# Patient Record
Sex: Male | Born: 1945 | Race: White | Hispanic: No | Smoking: Former smoker
Health system: Southern US, Community
[De-identification: ages and names within clinical notes are randomized; demographics above are authoritative.]

## PROBLEM LIST (undated history)

## (undated) DIAGNOSIS — M1990 Unspecified osteoarthritis, unspecified site: Secondary | ICD-10-CM

## (undated) DIAGNOSIS — K219 Gastro-esophageal reflux disease without esophagitis: Secondary | ICD-10-CM

## (undated) DIAGNOSIS — M1712 Unilateral primary osteoarthritis, left knee: Secondary | ICD-10-CM

## (undated) DIAGNOSIS — I10 Essential (primary) hypertension: Secondary | ICD-10-CM

## (undated) DIAGNOSIS — F32A Depression, unspecified: Secondary | ICD-10-CM

## (undated) DIAGNOSIS — Z9852 Vasectomy status: Secondary | ICD-10-CM

## (undated) DIAGNOSIS — E785 Hyperlipidemia, unspecified: Secondary | ICD-10-CM

## (undated) DIAGNOSIS — M719 Bursopathy, unspecified: Secondary | ICD-10-CM

## (undated) DIAGNOSIS — R519 Headache, unspecified: Secondary | ICD-10-CM

## (undated) DIAGNOSIS — R0683 Snoring: Principal | ICD-10-CM

## (undated) DIAGNOSIS — G4730 Sleep apnea, unspecified: Secondary | ICD-10-CM

## (undated) DIAGNOSIS — R51 Headache: Secondary | ICD-10-CM

## (undated) DIAGNOSIS — F329 Major depressive disorder, single episode, unspecified: Secondary | ICD-10-CM

## (undated) HISTORY — DX: Bursopathy, unspecified: M71.9

## (undated) HISTORY — DX: Unspecified osteoarthritis, unspecified site: M19.90

## (undated) HISTORY — DX: Essential (primary) hypertension: I10

## (undated) HISTORY — DX: Depression, unspecified: F32.A

## (undated) HISTORY — DX: Hyperlipidemia, unspecified: E78.5

## (undated) HISTORY — DX: Snoring: R06.83

## (undated) HISTORY — DX: Major depressive disorder, single episode, unspecified: F32.9

## (undated) HISTORY — PX: COLONOSCOPY: SHX174

## (undated) HISTORY — PX: JOINT REPLACEMENT: SHX530

## (undated) HISTORY — PX: CHOLECYSTECTOMY OPEN: SUR202

## (undated) HISTORY — PX: APPENDECTOMY: SHX54

## (undated) HISTORY — PX: CATARACT EXTRACTION W/ INTRAOCULAR LENS  IMPLANT, BILATERAL: SHX1307

## (undated) HISTORY — PX: TONSILLECTOMY AND ADENOIDECTOMY: SUR1326

---

## 1983-11-13 HISTORY — PX: KNEE ARTHROSCOPY: SUR90

## 2004-06-25 ENCOUNTER — Emergency Department (HOSPITAL_COMMUNITY): Admission: EM | Admit: 2004-06-25 | Discharge: 2004-06-25 | Payer: Self-pay | Admitting: Emergency Medicine

## 2012-09-02 DIAGNOSIS — B001 Herpesviral vesicular dermatitis: Secondary | ICD-10-CM | POA: Insufficient documentation

## 2013-01-28 DIAGNOSIS — E349 Endocrine disorder, unspecified: Secondary | ICD-10-CM | POA: Insufficient documentation

## 2014-01-27 DIAGNOSIS — R21 Rash and other nonspecific skin eruption: Secondary | ICD-10-CM | POA: Diagnosis not present

## 2014-02-16 DIAGNOSIS — M79609 Pain in unspecified limb: Secondary | ICD-10-CM | POA: Diagnosis not present

## 2014-03-23 DIAGNOSIS — M1710 Unilateral primary osteoarthritis, unspecified knee: Secondary | ICD-10-CM | POA: Diagnosis not present

## 2014-04-02 ENCOUNTER — Encounter (HOSPITAL_COMMUNITY): Payer: Self-pay | Admitting: Emergency Medicine

## 2014-04-02 ENCOUNTER — Emergency Department (HOSPITAL_COMMUNITY)
Admission: EM | Admit: 2014-04-02 | Discharge: 2014-04-02 | Disposition: A | Discharge status: Home / Self Care | Payer: Medicare Other | Attending: Emergency Medicine | Admitting: Emergency Medicine

## 2014-04-02 DIAGNOSIS — Z79899 Other long term (current) drug therapy: Secondary | ICD-10-CM | POA: Diagnosis not present

## 2014-04-02 DIAGNOSIS — S81009A Unspecified open wound, unspecified knee, initial encounter: Secondary | ICD-10-CM | POA: Diagnosis not present

## 2014-04-02 DIAGNOSIS — S91009A Unspecified open wound, unspecified ankle, initial encounter: Secondary | ICD-10-CM | POA: Diagnosis not present

## 2014-04-02 DIAGNOSIS — Z87891 Personal history of nicotine dependence: Secondary | ICD-10-CM | POA: Insufficient documentation

## 2014-04-02 DIAGNOSIS — Y929 Unspecified place or not applicable: Secondary | ICD-10-CM | POA: Insufficient documentation

## 2014-04-02 DIAGNOSIS — S81809A Unspecified open wound, unspecified lower leg, initial encounter: Principal | ICD-10-CM

## 2014-04-02 DIAGNOSIS — S81811A Laceration without foreign body, right lower leg, initial encounter: Secondary | ICD-10-CM

## 2014-04-02 DIAGNOSIS — Y939 Activity, unspecified: Secondary | ICD-10-CM | POA: Insufficient documentation

## 2014-04-02 DIAGNOSIS — W268XXA Contact with other sharp object(s), not elsewhere classified, initial encounter: Secondary | ICD-10-CM | POA: Insufficient documentation

## 2014-04-02 NOTE — ED Provider Notes (Signed)
CSN: [id number]     Arrival date & time 04/02/14  4132 History  This chart was scribed for non-physician practitioner working with Blanchard Kelch, MD, by Fernando Clark, ED Scribe. This patient was seen in room TR11C/TR11C and the patient's care was started at 7:37 PM.    Chief Complaint  Patient presents with  . Extremity Laceration      Patient is a 68 y.o. male presenting with skin laceration. The history is provided by the patient. No language interpreter was used.  Laceration Location:  Leg Leg laceration location:  R lower leg Depth:  Cutaneous Quality: straight   Bleeding: controlled   Injury mechanism: broken ceramic dish. Foreign body present:  No foreign bodies Relieved by:  None tried Worsened by:  Nothing tried Ineffective treatments:  None tried Tetanus status:  Up to date  HPI Comments: Fernando Clark is a 68 y.o. male who presents to the Emergency Department complaining of laceration to his right lower leg. Patient states that the laceration occurred when he accidentally kicked a ceramic dog dish and it came up and cut his leg. Bleeding is controlled at this time. Patient denies any difficulty ambulating. Patient denies any personal history of DM. He states that his last t-dap was within the last 1-2 years. Patient states that he does not have a PCP currently, he recently moved from [place], but he is trying to find one. Patient denies any numbness or pain in his foot.     History reviewed. No pertinent past medical history. Past Surgical History  Procedure Laterality Date  . Cholecystectomy     No family history on file. History  Substance Use Topics  . Smoking status: Former Research scientist (life sciences)  . Smokeless tobacco: Not on file  . Alcohol Use: Yes    Review of Systems  Musculoskeletal: Negative for myalgias.  Skin: Positive for wound (laceration to right lower leg).  Neurological: Negative for numbness.  All other systems reviewed and are  negative.     Allergies  Review of patient's allergies indicates no known allergies.  Home Medications   Prior to Admission medications   Medication Sig Start Date End Date Taking? Authorizing Provider  atorvastatin (LIPITOR) 20 MG tablet Take 20 mg by mouth daily.   Yes Historical Provider, MD  citalopram (CELEXA) 20 MG tablet Take 20 mg by mouth daily.   Yes Historical Provider, MD  ibuprofen (ADVIL,MOTRIN) 200 MG tablet Take 600 mg by mouth every 6 (six) hours as needed for mild pain.   Yes Historical Provider, MD   There were no vitals taken for this visit.  Physical Exam  Nursing note and vitals reviewed. Constitutional: He is oriented to person, place, and time. He appears well-developed and well-nourished. No distress.  HENT:  Head: Normocephalic and atraumatic.  Eyes: EOM are normal.  Neck: Neck supple. No tracheal deviation present.  Cardiovascular: Normal rate.   Pulmonary/Chest: Effort normal. No respiratory distress.  Musculoskeletal: Normal range of motion.  Neurological: He is alert and oriented to person, place, and time.  Skin: Skin is warm and dry.  Psychiatric: He has a normal mood and affect. His behavior is normal.    ED Course  Procedures (including critical care time)   COORDINATION OF CARE: 7:43 PM- Will order wound care. Pt advised of plan for treatment and pt agrees.  LACERATION REPAIR Performed by: Fernando Quill, NP Consent: Verbal consent obtained. Risks and benefits: risks, benefits and alternatives were discussed Patient identity confirmed: provided demographic data Time  out performed prior to procedure Prepped and Draped in normal sterile fashion Wound explored Laceration Location: right lower leg Laceration Length: 5 cm No Foreign Bodies seen or palpated Anesthesia: local infiltration Local anesthetic: lidocaine 2% no epinephrine Anesthetic total: 3 ml Irrigation method: syringe Amount of cleaning: standard Skin closure:  staples Number of  staples: 9 Patient tolerance: Patient tolerated the procedure well with no immediate complications.   Labs Review Labs Reviewed - No data to display  Imaging Review No results found.   EKG Interpretation None      MDM   Final diagnoses:  None    Right lower leg laceration.  Tetanus up to date.  I personally performed the services described in this documentation, which was scribed in my presence. The recorded information has been reviewed and is accurate.     Fernando Herrlich, NP 04/03/14 [phone]

## 2014-04-02 NOTE — ED Notes (Signed)
Pt. presents with right lower leg laceration approx . 2 inches accidentally kicked dog's plate and hit his leg , ambulatory , minimal bleeding at arrival .

## 2014-04-02 NOTE — Discharge Instructions (Signed)
Staple Wound Closure Staples are used to help a wound heal faster by holding the edges of the wound together. HOME CARE  Keep the area around the staples clean and dry.  Rest and raise (elevate) the injured part above the level of your heart.  See your doctor for a follow-up check of the wound.  See your doctor to have the staples removed.  Clean the wound daily with water.  Do not soak the wound in water for long periods of time.  Let air reach the wound as it heals. GET HELP RIGHT AWAY IF:   You have redness or puffiness around the wound.  You have a red line going away from the wound.  You have more pain or tenderness.  You have yellowish-white fluid (pus) coming from the wound.  Your wound does not stay together after the staples have been taken out.  You see something coming out of the wound, such as wood or glass.  You have problems moving the injured area.  You have a fever or lasting symptoms for more than 2-3 days.  You have a fever and your symptoms suddenly get worse. MAKE SURE YOU:   Understand these instructions.  Will watch this condition.  Will get help right away if you are not doing well or get worse. Document Released: 08/07/2008 Document Revised: 07/23/2012 Document Reviewed: 05/11/2012 [facility] Patient Information 2014 [place].

## 2014-04-03 NOTE — ED Provider Notes (Signed)
Medical screening examination/treatment/procedure(s) were performed by non-physician practitioner and as supervising physician I was immediately available for consultation/collaboration.   EKG Interpretation None        Blanchard Kelch, MD 04/03/14 1238

## 2014-04-12 DIAGNOSIS — Z4802 Encounter for removal of sutures: Secondary | ICD-10-CM | POA: Diagnosis not present

## 2014-05-24 DIAGNOSIS — W57XXXA Bitten or stung by nonvenomous insect and other nonvenomous arthropods, initial encounter: Secondary | ICD-10-CM | POA: Diagnosis not present

## 2014-05-24 DIAGNOSIS — S40269A Insect bite (nonvenomous) of unspecified shoulder, initial encounter: Secondary | ICD-10-CM | POA: Diagnosis not present

## 2014-06-24 DIAGNOSIS — L909 Atrophic disorder of skin, unspecified: Secondary | ICD-10-CM | POA: Diagnosis not present

## 2014-06-24 DIAGNOSIS — L919 Hypertrophic disorder of the skin, unspecified: Secondary | ICD-10-CM | POA: Diagnosis not present

## 2014-07-19 DIAGNOSIS — W540XXA Bitten by dog, initial encounter: Secondary | ICD-10-CM | POA: Diagnosis not present

## 2014-07-19 DIAGNOSIS — S61409A Unspecified open wound of unspecified hand, initial encounter: Secondary | ICD-10-CM | POA: Diagnosis not present

## 2014-09-01 DIAGNOSIS — Z23 Encounter for immunization: Secondary | ICD-10-CM | POA: Diagnosis not present

## 2014-09-07 DIAGNOSIS — M1811 Unilateral primary osteoarthritis of first carpometacarpal joint, right hand: Secondary | ICD-10-CM | POA: Diagnosis not present

## 2014-09-17 DIAGNOSIS — M25562 Pain in left knee: Secondary | ICD-10-CM | POA: Diagnosis not present

## 2014-09-30 DIAGNOSIS — M7660 Achilles tendinitis, unspecified leg: Secondary | ICD-10-CM | POA: Diagnosis not present

## 2014-09-30 DIAGNOSIS — M7150 Other bursitis, not elsewhere classified, unspecified site: Secondary | ICD-10-CM | POA: Diagnosis not present

## 2014-10-05 DIAGNOSIS — Z6835 Body mass index (BMI) 35.0-35.9, adult: Secondary | ICD-10-CM | POA: Diagnosis not present

## 2014-10-05 DIAGNOSIS — R5383 Other fatigue: Secondary | ICD-10-CM | POA: Diagnosis not present

## 2014-10-05 DIAGNOSIS — E785 Hyperlipidemia, unspecified: Secondary | ICD-10-CM | POA: Diagnosis not present

## 2014-10-05 DIAGNOSIS — F329 Major depressive disorder, single episode, unspecified: Secondary | ICD-10-CM | POA: Diagnosis not present

## 2014-10-05 DIAGNOSIS — M1990 Unspecified osteoarthritis, unspecified site: Secondary | ICD-10-CM | POA: Diagnosis not present

## 2014-10-05 DIAGNOSIS — R0683 Snoring: Secondary | ICD-10-CM | POA: Diagnosis not present

## 2014-10-14 ENCOUNTER — Encounter: Payer: Self-pay | Admitting: *Deleted

## 2014-10-14 ENCOUNTER — Ambulatory Visit (INDEPENDENT_AMBULATORY_CARE_PROVIDER_SITE_OTHER): Payer: Medicare Other | Admitting: Neurology

## 2014-10-14 ENCOUNTER — Encounter: Payer: Self-pay | Admitting: Neurology

## 2014-10-14 VITALS — BP 149/86 | HR 74 | Resp 16 | Ht 73.5 in | Wt 261.0 lb

## 2014-10-14 DIAGNOSIS — R0683 Snoring: Secondary | ICD-10-CM | POA: Diagnosis not present

## 2014-10-14 DIAGNOSIS — E65 Localized adiposity: Secondary | ICD-10-CM | POA: Diagnosis not present

## 2014-10-14 DIAGNOSIS — J342 Deviated nasal septum: Secondary | ICD-10-CM | POA: Diagnosis not present

## 2014-10-14 HISTORY — DX: Snoring: R06.83

## 2014-10-14 NOTE — Progress Notes (Signed)
SLEEP MEDICINE CLINIC   Provider:  Larey Seat, M D  Referring Provider: Velna Hatchet, MD Primary Care Physician:   Chief Complaint  Patient presents with  . NP [facility]    Rm 11, alone    HPI:  Fernando Clark is a 68 y.o. male , caucasian, right handed, retired - and seen here as a referral from Dr. Ardeth Perfect for a sleep evaluation.   Reports daytime fatigue and sleepiness, as well as nocturnal snoring, which has been witnessed by his wife. Apneas have not been noted, and the patient reports that he has a fragmented sleep pattern waking up at 4 AM . The patient goes to bed at 10 PM sometimes at 12 PM, usually sleeps promptly- he will have one bathroom break, no more. He sleeps with his wife in the same bed as well as with 2 large dogs ( boxer - 80 pounds plus each ). He usually sleep for 5 hours only.  He prefers lateral recumbent or prone sleep, and wakes up in these positions, never supine. 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 He used to smoke  until 10 years ago, 1 ppd .   He reports getting excessively daytime sleepy while driving, and often takes a power nap in the car before continuing en route. He has no problems in conference , in church or theater. Tonsillectomy in childhood, age 54.   Review of Systems: Out of a complete 14 system review, the patient complains of only the following symptoms, and all other reviewed systems are negative. smoking was a nervous habit, he has no depression or anxiety compliant. He has had anxiety during times of high stress.  Snoring, but unsure if apnea is present. No sleep choking.  patient has headaches daily, which he attributes part from caffeine, part from sinus, part from neck tension.   Epworth score 16 , Fatigue severity score 46  , geriatric  depression score 7.   Non smoker, rare ETOH drinker, caffeine in form of sweet tea, 4 glasses per meal.    History   Social History    . Marital Status: Married    Spouse Name: N/A    Number of Children: N/A  . Years of Education: N/A   Occupational History  . Not on file.   Social History Main Topics  . Smoking status: Former Smoker -- 3.00 packs/day    Types: Cigarettes    Quit date: 11/13/2003  . Smokeless tobacco: Not on file  . Alcohol Use: 0.0 oz/week    0 Not specified per week     Comment: social  . Drug Use: No  . Sexual Activity: Not on file   Other Topics Concern  . Not on file   Social History Narrative   Married, 3 kids, 5 grandkids. Retired. (farmer, Customer service manager).  Caffeine 4 cups daily.    Family History  Problem Relation Age of Onset  . Arthritis Mother   . Breast cancer Mother   . Depression Mother   . Hypertension Mother   . Depression Father   . Stroke Father   . Arthritis Sister   . Cancer      grandparent    Past Medical History  Diagnosis Date  . Osteoarthritis     L knee  . Depression     tempermental mood  . HLD (hyperlipidemia)   . Bursitis     R heel  .  Snoring   . Snoring 10/14/2014    Past Surgical History  Procedure Laterality Date  . Cholecystectomy    . Tonsillectomy and adnoidectomy      Current Outpatient Prescriptions  Medication Sig Dispense Refill  . atorvastatin (LIPITOR) 20 MG tablet Take 20 mg by mouth daily.    . citalopram (CELEXA) 20 MG tablet Take 20 mg by mouth daily.    Marland Kitchen ibuprofen (ADVIL,MOTRIN) 200 MG tablet Take 600 mg by mouth every 6 (six) hours as needed for mild pain.    . valACYclovir (VALTREX) 1000 MG tablet as needed. Take 2 tabs at onsetRepeat in 12 hours     No current facility-administered medications for this visit.    Allergies as of 10/14/2014  . (No Known Allergies)    Vitals: BP 149/86 mmHg  Pulse 74  Resp 16  Ht 6' 1.5" (1.867 m)  Wt 261 lb (118.389 kg)  BMI 33.96 kg/m2 Last Weight:  Wt Readings from Last 1 Encounters:  10/14/14 261 lb (118.389 kg)       Last Height:   Ht Readings from Last 1 Encounters:   10/14/14 6' 1.5" (1.867 m)    Physical exam:  General: The patient is awake, alert and appears not in acute distress. The patient is well groomed.  Weight gain since retirement in the last 14 month , 15 pounds/ Not a regular exerciser.  Head: Normocephalic, atraumatic. Neck is supple. Mallampati 5 ,  neck circumference: 17 . Nasal airflow  Unrestricted, he had a nasal fracture in childhood, TMJ is  Not  evident . Retrognathia is not seen.  Cardiovascular:  Regular rate and rhythm, without  murmurs or carotid bruit, and without distended neck veins. Respiratory: Lungs are clear to auscultation. Skin:  Without evidence of edema, or rash Trunk: BMI is  elevated and patient  has normal posture.  Neurologic exam : The patient is awake and alert, oriented to place and time.   Memory subjective described as intact. There is a normal attention span & concentration ability.  Speech is fluent without dysarthria, dysphonia or aphasia. Mood and affect are appropriate.  Cranial nerves: Pupils are equal and briskly reactive to light.  Funduscopic exam without evidence of pallor or edema. Extraocular movements  in vertical and horizontal planes intact and without nystagmus. Visual fields by finger perimetry are intact. Hearing to finger rub intact.  Facial sensation intact to fine touch. Facial motor strength is symmetric and tongue and uvula move midline.  Motor exam:  Normal tone, muscle bulk and symmetric, strength in all extremities. He l has some arthritic changes in his hands and fingers, having difficulties to open a jar lid. He has noted knee pain on the left knee, he has mid foot pain, at the Charcot line , while playing golf.    Sensory:  Fine touch, pinprick and vibration were tested in all extremities.  Proprioception is tested in the upper extremities only. This was normal.  Coordination: Rapid alternating movements in the fingers/hands is normal.  Finger-to-nose maneuver  normal  without evidence of ataxia, dysmetria or tremor.  Gait and station: Patient walks without assistive device and is able unassisted to climb up to the exam table.  Strength within normal limits. Stance is stable and normal.  Tandem gait is unfragmented. Romberg testing is  negative.  Deep tendon reflexes: in the upper and lower extremities are symmetric and intact. Babinski maneuver response is downgoing.   Assessment:  After physical and neurologic examination, review of  laboratory studies, imaging, neurophysiology testing and pre-existing records, assessment is   1)  Overweight patient with high grade Mallampati and neck size, full facial hair. He snores,   The patient was advised of the nature of the diagnosed sleep disorder , the treatment options and risks for general a health and wellness arising from not treating the condition.  Visit duration was 30  minutes.   Plan:  Treatment plan and additional workup : SPLIT study with CO2 , AHi 15 and score at  Patient has headaches daily, which he attributes part from caffeine, part from sinus, part from neck tension.      Asencion Partridge Lavon Horn MD  10/14/2014

## 2014-10-14 NOTE — Patient Instructions (Signed)
Sleep Apnea  Sleep apnea is a sleep disorder characterized by abnormal pauses in breathing while you sleep. When your breathing pauses, the level of oxygen in your blood decreases. This causes you to move out of deep sleep and into light sleep. As a result, your quality of sleep is poor, and the system that carries your blood throughout your body (cardiovascular system) experiences stress. If sleep apnea remains untreated, the following conditions can develop:  High blood pressure (hypertension).  Coronary artery disease.  Inability to achieve or maintain an erection (impotence).  Impairment of your thought process (cognitive dysfunction). There are three types of sleep apnea: 1. Obstructive sleep apnea--Pauses in breathing during sleep because of a blocked airway. 2. Central sleep apnea--Pauses in breathing during sleep because the area of the brain that controls your breathing does not send the correct signals to the muscles that control breathing. 3. Mixed sleep apnea--A combination of both obstructive and central sleep apnea. RISK FACTORS The following risk factors can increase your risk of developing sleep apnea:  Being overweight.  Smoking.  Having narrow passages in your nose and throat.  Being of older age.  Being male.  Alcohol use.  Sedative and tranquilizer use.  Ethnicity. Among individuals younger than 35 years, African Americans are at increased risk of sleep apnea. SYMPTOMS   Difficulty staying asleep.  Daytime sleepiness and fatigue.  Loss of energy.  Irritability.  Loud, heavy snoring.  Morning headaches.  Trouble concentrating.  Forgetfulness.  Decreased interest in sex. DIAGNOSIS  In order to diagnose sleep apnea, your caregiver will perform a physical examination. Your caregiver may suggest that you take a home sleep test. Your caregiver may also recommend that you spend the night in a sleep lab. In the sleep lab, several monitors record  information about your heart, lungs, and brain while you sleep. Your leg and arm movements and blood oxygen level are also recorded. TREATMENT The following actions may help to resolve mild sleep apnea:  Sleeping on your side.   Using a decongestant if you have nasal congestion.   Avoiding the use of depressants, including alcohol, sedatives, and narcotics.   Losing weight and modifying your diet if you are overweight. There also are devices and treatments to help open your airway:  Oral appliances. These are custom-made mouthpieces that shift your lower jaw forward and slightly open your bite. This opens your airway.  Devices that create positive airway pressure. This positive pressure "splints" your airway open to help you breathe better during sleep. The following devices create positive airway pressure:  Continuous positive airway pressure (CPAP) device. The CPAP device creates a continuous level of air pressure with an air pump. The air is delivered to your airway through a mask while you sleep. This continuous pressure keeps your airway open.  Nasal expiratory positive airway pressure (EPAP) device. The EPAP device creates positive air pressure as you exhale. The device consists of single-use valves, which are inserted into each nostril and held in place by adhesive. The valves create very little resistance when you inhale but create much more resistance when you exhale. That increased resistance creates the positive airway pressure. This positive pressure while you exhale keeps your airway open, making it easier to breath when you inhale again.  Bilevel positive airway pressure (BPAP) device. The BPAP device is used mainly in patients with central sleep apnea. This device is similar to the CPAP device because it also uses an air pump to deliver continuous air pressure   through a mask. However, with the BPAP machine, the pressure is set at two different levels. The pressure when you  exhale is lower than the pressure when you inhale.  Surgery. Typically, surgery is only done if you cannot comply with less invasive treatments or if the less invasive treatments do not improve your condition. Surgery involves removing excess tissue in your airway to create a wider passage way. Document Released: 10/19/2002 Document Revised: 02/23/2013 Document Reviewed: 03/06/2012 [employer] Patient Information 2015 [employer]. This information is not intended to replace advice given to you by your health care provider. Make sure you discuss any questions you have with your health care provider.  

## 2014-11-20 DIAGNOSIS — R05 Cough: Secondary | ICD-10-CM | POA: Diagnosis not present

## 2014-11-20 DIAGNOSIS — J019 Acute sinusitis, unspecified: Secondary | ICD-10-CM | POA: Diagnosis not present

## 2014-11-20 DIAGNOSIS — R0982 Postnasal drip: Secondary | ICD-10-CM | POA: Diagnosis not present

## 2014-12-10 DIAGNOSIS — Z79899 Other long term (current) drug therapy: Secondary | ICD-10-CM | POA: Diagnosis not present

## 2014-12-10 DIAGNOSIS — Z125 Encounter for screening for malignant neoplasm of prostate: Secondary | ICD-10-CM | POA: Diagnosis not present

## 2014-12-10 DIAGNOSIS — E785 Hyperlipidemia, unspecified: Secondary | ICD-10-CM | POA: Diagnosis not present

## 2014-12-12 ENCOUNTER — Ambulatory Visit (INDEPENDENT_AMBULATORY_CARE_PROVIDER_SITE_OTHER): Payer: Medicare Other | Admitting: Neurology

## 2014-12-12 VITALS — BP 118/80

## 2014-12-12 DIAGNOSIS — J342 Deviated nasal septum: Secondary | ICD-10-CM

## 2014-12-12 DIAGNOSIS — G4730 Sleep apnea, unspecified: Secondary | ICD-10-CM

## 2014-12-12 DIAGNOSIS — G4700 Insomnia, unspecified: Secondary | ICD-10-CM

## 2014-12-12 DIAGNOSIS — R0683 Snoring: Secondary | ICD-10-CM

## 2014-12-12 DIAGNOSIS — E65 Localized adiposity: Secondary | ICD-10-CM

## 2014-12-13 NOTE — Sleep Study (Signed)
Please see the scanned sleep study interpretation located in the Procedure tab within the Chart Review section. 

## 2014-12-21 ENCOUNTER — Encounter: Payer: Medicare Other | Admitting: Neurology

## 2014-12-22 DIAGNOSIS — G4709 Other insomnia: Secondary | ICD-10-CM | POA: Insufficient documentation

## 2014-12-23 ENCOUNTER — Encounter: Payer: Self-pay | Admitting: Neurology

## 2014-12-27 ENCOUNTER — Encounter: Payer: Self-pay | Admitting: Internal Medicine

## 2014-12-27 DIAGNOSIS — R0683 Snoring: Secondary | ICD-10-CM | POA: Diagnosis not present

## 2014-12-27 DIAGNOSIS — M1990 Unspecified osteoarthritis, unspecified site: Secondary | ICD-10-CM | POA: Diagnosis not present

## 2014-12-27 DIAGNOSIS — Z6835 Body mass index (BMI) 35.0-35.9, adult: Secondary | ICD-10-CM | POA: Diagnosis not present

## 2014-12-27 DIAGNOSIS — Z125 Encounter for screening for malignant neoplasm of prostate: Secondary | ICD-10-CM | POA: Diagnosis not present

## 2014-12-27 DIAGNOSIS — G4700 Insomnia, unspecified: Secondary | ICD-10-CM | POA: Diagnosis not present

## 2014-12-27 DIAGNOSIS — R5383 Other fatigue: Secondary | ICD-10-CM | POA: Diagnosis not present

## 2014-12-27 DIAGNOSIS — Z0000 Encounter for general adult medical examination without abnormal findings: Secondary | ICD-10-CM | POA: Diagnosis not present

## 2014-12-27 DIAGNOSIS — E785 Hyperlipidemia, unspecified: Secondary | ICD-10-CM | POA: Diagnosis not present

## 2014-12-27 DIAGNOSIS — Z1389 Encounter for screening for other disorder: Secondary | ICD-10-CM | POA: Diagnosis not present

## 2014-12-27 DIAGNOSIS — F329 Major depressive disorder, single episode, unspecified: Secondary | ICD-10-CM | POA: Diagnosis not present

## 2014-12-27 DIAGNOSIS — Z23 Encounter for immunization: Secondary | ICD-10-CM | POA: Diagnosis not present

## 2015-01-04 DIAGNOSIS — J310 Chronic rhinitis: Secondary | ICD-10-CM | POA: Diagnosis not present

## 2015-01-04 DIAGNOSIS — R0683 Snoring: Secondary | ICD-10-CM | POA: Diagnosis not present

## 2015-01-04 DIAGNOSIS — F458 Other somatoform disorders: Secondary | ICD-10-CM | POA: Diagnosis not present

## 2015-01-12 DIAGNOSIS — F0630 Mood disorder due to known physiological condition, unspecified: Secondary | ICD-10-CM | POA: Diagnosis not present

## 2015-02-07 ENCOUNTER — Ambulatory Visit (AMBULATORY_SURGERY_CENTER): Payer: Self-pay | Admitting: *Deleted

## 2015-02-07 ENCOUNTER — Telehealth: Payer: Self-pay | Admitting: *Deleted

## 2015-02-07 VITALS — Ht 72.75 in | Wt 259.6 lb

## 2015-02-07 DIAGNOSIS — Z1211 Encounter for screening for malignant neoplasm of colon: Secondary | ICD-10-CM

## 2015-02-07 MED ORDER — MOVIPREP 100 G PO SOLR: 1.0000 | Freq: Once | ORAL | Status: DC

## 2015-02-07 NOTE — Telephone Encounter (Signed)
Did have pt fill out records release for old records from [facility], Dr [employer].  Pt had a colonoscopy > 10 years ago with Dr Daine Gip in [place].  Records release given to [place] . Colonoscopy scheduled for 4-11 Monday with Dr Soyla Murphy [facility]

## 2015-02-07 NOTE — Progress Notes (Signed)
No egg or soy allergy No home 02  No diet pills Pt states he goes to sleep very easy, it doesn't take much to sedate him but no issues  emmi video to e mail. Did have pt fill out records release for old records from [facility], Dr [employer].  Pt had a colonoscopy > 10 years ago with Dr Daine Gip in [place].  Records release given to [place] . Colonoscopy scheduled for 4-11 Monday with Dr Henrene Pastor

## 2015-02-10 DIAGNOSIS — L255 Unspecified contact dermatitis due to plants, except food: Secondary | ICD-10-CM | POA: Diagnosis not present

## 2015-02-10 DIAGNOSIS — F0630 Mood disorder due to known physiological condition, unspecified: Secondary | ICD-10-CM | POA: Diagnosis not present

## 2015-02-11 HISTORY — PX: ESOPHAGOGASTRODUODENOSCOPY: SHX1529

## 2015-02-21 ENCOUNTER — Ambulatory Visit (AMBULATORY_SURGERY_CENTER): Payer: Medicare Other | Admitting: Internal Medicine

## 2015-02-21 ENCOUNTER — Encounter: Payer: Self-pay | Admitting: Internal Medicine

## 2015-02-21 VITALS — BP 142/89 | HR 59 | Temp 95.8°F | Resp 15 | Ht 72.75 in | Wt 259.0 lb

## 2015-02-21 DIAGNOSIS — E669 Obesity, unspecified: Secondary | ICD-10-CM | POA: Diagnosis not present

## 2015-02-21 DIAGNOSIS — D122 Benign neoplasm of ascending colon: Secondary | ICD-10-CM | POA: Diagnosis not present

## 2015-02-21 DIAGNOSIS — Z1211 Encounter for screening for malignant neoplasm of colon: Secondary | ICD-10-CM | POA: Diagnosis not present

## 2015-02-21 MED ORDER — SODIUM CHLORIDE 0.9 % IV SOLN: 500.0000 mL | INTRAVENOUS | Status: DC

## 2015-02-21 NOTE — Progress Notes (Signed)
To recovery, report to [place], RN, VSS 

## 2015-02-21 NOTE — Patient Instructions (Signed)
YOU HAD AN ENDOSCOPIC PROCEDURE TODAY AT [facility]:   Refer to the procedure report that was given to you for any specific questions about what was found during the examination.  If the procedure report does not answer your questions, please call your gastroenterologist to clarify.  If you requested that your care partner not be given the details of your procedure findings, then the procedure report has been included in a sealed envelope for you to review at your convenience later.  YOU SHOULD EXPECT: Some feelings of bloating in the abdomen. Passage of more gas than usual.  Walking can help get rid of the air that was put into your GI tract during the procedure and reduce the bloating. If you had a lower endoscopy (such as a colonoscopy or flexible sigmoidoscopy) you may notice spotting of blood in your stool or on the toilet paper. If you underwent a bowel prep for your procedure, you may not have a normal bowel movement for a few days.  Please Note:  You might notice some irritation and congestion in your nose or some drainage.  This is from the oxygen used during your procedure.  There is no need for concern and it should clear up in a day or so.  SYMPTOMS TO REPORT IMMEDIATELY:   Following lower endoscopy (colonoscopy or flexible sigmoidoscopy):  Excessive amounts of blood in the stool  Significant tenderness or worsening of abdominal pains  Swelling of the abdomen that is new, acute  Fever of 100F or higher    For urgent or emergent issues, a gastroenterologist can be reached at any hour by calling [phone].   DIET: Your first meal following the procedure should be a small meal and then it is ok to progress to your normal diet. Heavy or fried foods are harder to digest and may make you feel nauseous or bloated.  Likewise, meals heavy in dairy and vegetables can increase bloating.  Drink plenty of fluids but you should avoid alcoholic beverages for 24  hours.  ACTIVITY:  You should plan to take it easy for the rest of today and you should NOT DRIVE or use heavy machinery until tomorrow (because of the sedation medicines used during the test).    FOLLOW UP: Our staff will call the number listed on your records the next business day following your procedure to check on you and address any questions or concerns that you may have regarding the information given to you following your procedure. If we do not reach you, we will leave a message.  However, if you are feeling well and you are not experiencing any problems, there is no need to return our call.  We will assume that you have returned to your regular daily activities without incident.  If any biopsies were taken you will be contacted by phone or by letter within the next 1-3 weeks.  Please call us at [phone] if you have not heard about the biopsies in 3 weeks.    SIGNATURES/CONFIDENTIALITY: You and/or your care partner have signed paperwork which will be entered into your electronic medical record.  These signatures attest to the fact that that the information above on your After Visit Summary has been reviewed and is understood.  Full responsibility of the confidentiality of this discharge information lies with you and/or your care-partner.   INFORMATION ON POLYPS,DIVERTICULOSIS ,& HIGH FIBER DIET GIVEN TO YOU TODAY

## 2015-02-21 NOTE — Progress Notes (Signed)
Called to room to assist during endoscopic procedure.  Patient ID and intended procedure confirmed with present staff. Received instructions for my participation in the procedure from the performing physician.  

## 2015-02-21 NOTE — Telephone Encounter (Signed)
Procedure today 02-21-2015.  With [employer]

## 2015-02-21 NOTE — Op Note (Signed)
[facility]  [employer]. [place], [id number]   COLONOSCOPY PROCEDURE REPORT  PATIENT: Fernando Clark, Fernando Clark  MR#: [id number] BIRTHDATE: 12-Sep-1946 , 76  yrs. old GENDER: male ENDOSCOPIST: Eustace Quail, MD REFERRED ZN:BVAPO Ardeth Perfect, M.D. PROCEDURE DATE:  02/21/2015 PROCEDURE:   Colonoscopy, screening and Colonoscopy with snare polypectomy x 2 First Screening Colonoscopy - Avg.  risk and is 50 yrs.  old or older - No.  Prior Negative Screening - Now for repeat screening. 10 or more years since last screening  History of Adenoma - Now for follow-up colonoscopy & has been > or = to 3 yrs.  N/A ASA CLASS:   Class II INDICATIONS:Screening for colonic neoplasia and Colorectal Neoplasm Risk Assessment for this procedure is average risk. The patient reports negative colonoscopy 10+ years ago in [place]( no available records). MEDICATIONS: Monitored anesthesia care and Propofol 300 mg IV  DESCRIPTION OF PROCEDURE:   After the risks benefits and alternatives of the procedure were thoroughly explained, informed consent was obtained.  The digital rectal exam revealed no abnormalities of the rectum.   The LB [id number]  endoscope was introduced through the anus and advanced to the cecum, which was identified by both the appendix and ileocecal valve. No adverse events experienced.   The quality of the prep was excellent. (MoviPrep was used)  The instrument was then slowly withdrawn as the colon was fully examined.   COLON FINDINGS: Two polyps measuring 3 mm in size were found in the ascending colon.  A polypectomy was performed with a cold snare. The resection was complete, the polyp tissue was completely retrieved and sent to histology.   There was moderate diverticulosis noted in the left colon.   The examination was otherwise normal.  Retroflexed views revealed internal hemorrhoids. The time to cecum = 2.7 Withdrawal time = 11.2   The scope  was withdrawn and the procedure completed. COMPLICATIONS: There were no immediate complications.  ENDOSCOPIC IMPRESSION: 1.   Two polyps were found in the ascending colon; polypectomy was performed with a cold snare 2.   Moderate diverticulosis was noted in the left colon 3.   The examination was otherwise normal  RECOMMENDATIONS: 1. Repeat colonoscopy in 5 years if polyp adenomatous; otherwise 10 years  eSigned:  Eustace Quail, MD 02/21/2015 11:51 AM   cc: The Patient and Velna Hatchet MD

## 2015-02-22 ENCOUNTER — Telehealth: Payer: Self-pay

## 2015-02-22 DIAGNOSIS — J310 Chronic rhinitis: Secondary | ICD-10-CM | POA: Diagnosis not present

## 2015-02-22 NOTE — Telephone Encounter (Signed)
No answer, left voicemail message.

## 2015-02-23 DIAGNOSIS — F0630 Mood disorder due to known physiological condition, unspecified: Secondary | ICD-10-CM | POA: Diagnosis not present

## 2015-03-01 ENCOUNTER — Encounter: Payer: Self-pay | Admitting: Internal Medicine

## 2015-03-10 DIAGNOSIS — F331 Major depressive disorder, recurrent, moderate: Secondary | ICD-10-CM | POA: Diagnosis not present

## 2015-03-23 DIAGNOSIS — F331 Major depressive disorder, recurrent, moderate: Secondary | ICD-10-CM | POA: Diagnosis not present

## 2015-04-12 DIAGNOSIS — F331 Major depressive disorder, recurrent, moderate: Secondary | ICD-10-CM | POA: Diagnosis not present

## 2015-05-04 DIAGNOSIS — F413 Other mixed anxiety disorders: Secondary | ICD-10-CM | POA: Diagnosis not present

## 2015-05-04 DIAGNOSIS — F331 Major depressive disorder, recurrent, moderate: Secondary | ICD-10-CM | POA: Diagnosis not present

## 2015-06-02 DIAGNOSIS — M7662 Achilles tendinitis, left leg: Secondary | ICD-10-CM | POA: Diagnosis not present

## 2015-06-02 DIAGNOSIS — G5762 Lesion of plantar nerve, left lower limb: Secondary | ICD-10-CM | POA: Diagnosis not present

## 2015-06-03 DIAGNOSIS — H1013 Acute atopic conjunctivitis, bilateral: Secondary | ICD-10-CM | POA: Diagnosis not present

## 2015-06-24 DIAGNOSIS — M1712 Unilateral primary osteoarthritis, left knee: Secondary | ICD-10-CM | POA: Diagnosis not present

## 2015-06-24 DIAGNOSIS — M25562 Pain in left knee: Secondary | ICD-10-CM | POA: Diagnosis not present

## 2015-07-29 DIAGNOSIS — M7662 Achilles tendinitis, left leg: Secondary | ICD-10-CM | POA: Diagnosis not present

## 2015-07-29 DIAGNOSIS — G5762 Lesion of plantar nerve, left lower limb: Secondary | ICD-10-CM | POA: Diagnosis not present

## 2015-09-06 DIAGNOSIS — M1712 Unilateral primary osteoarthritis, left knee: Secondary | ICD-10-CM | POA: Diagnosis not present

## 2015-09-06 DIAGNOSIS — M25562 Pain in left knee: Secondary | ICD-10-CM | POA: Diagnosis not present

## 2015-09-09 DIAGNOSIS — F331 Major depressive disorder, recurrent, moderate: Secondary | ICD-10-CM | POA: Diagnosis not present

## 2015-09-13 DIAGNOSIS — M1712 Unilateral primary osteoarthritis, left knee: Secondary | ICD-10-CM | POA: Diagnosis not present

## 2015-09-20 DIAGNOSIS — M1712 Unilateral primary osteoarthritis, left knee: Secondary | ICD-10-CM | POA: Diagnosis not present

## 2015-09-29 DIAGNOSIS — Z23 Encounter for immunization: Secondary | ICD-10-CM | POA: Diagnosis not present

## 2015-11-02 DIAGNOSIS — M25562 Pain in left knee: Secondary | ICD-10-CM | POA: Diagnosis not present

## 2015-11-02 DIAGNOSIS — M1712 Unilateral primary osteoarthritis, left knee: Secondary | ICD-10-CM | POA: Diagnosis not present

## 2016-01-05 DIAGNOSIS — Z125 Encounter for screening for malignant neoplasm of prostate: Secondary | ICD-10-CM | POA: Diagnosis not present

## 2016-01-05 DIAGNOSIS — E785 Hyperlipidemia, unspecified: Secondary | ICD-10-CM | POA: Diagnosis not present

## 2016-01-10 DIAGNOSIS — L918 Other hypertrophic disorders of the skin: Secondary | ICD-10-CM | POA: Diagnosis not present

## 2016-01-10 DIAGNOSIS — E784 Other hyperlipidemia: Secondary | ICD-10-CM | POA: Diagnosis not present

## 2016-01-10 DIAGNOSIS — Z0000 Encounter for general adult medical examination without abnormal findings: Secondary | ICD-10-CM | POA: Diagnosis not present

## 2016-01-10 DIAGNOSIS — Z23 Encounter for immunization: Secondary | ICD-10-CM | POA: Diagnosis not present

## 2016-01-10 DIAGNOSIS — G4700 Insomnia, unspecified: Secondary | ICD-10-CM | POA: Diagnosis not present

## 2016-01-10 DIAGNOSIS — M1990 Unspecified osteoarthritis, unspecified site: Secondary | ICD-10-CM | POA: Diagnosis not present

## 2016-01-10 DIAGNOSIS — Z6831 Body mass index (BMI) 31.0-31.9, adult: Secondary | ICD-10-CM | POA: Diagnosis not present

## 2016-01-10 DIAGNOSIS — Z1389 Encounter for screening for other disorder: Secondary | ICD-10-CM | POA: Diagnosis not present

## 2016-01-10 DIAGNOSIS — F325 Major depressive disorder, single episode, in full remission: Secondary | ICD-10-CM | POA: Diagnosis not present

## 2016-01-23 DIAGNOSIS — M1712 Unilateral primary osteoarthritis, left knee: Secondary | ICD-10-CM | POA: Diagnosis not present

## 2016-01-30 DIAGNOSIS — Z1212 Encounter for screening for malignant neoplasm of rectum: Secondary | ICD-10-CM | POA: Diagnosis not present

## 2016-02-13 DIAGNOSIS — M7542 Impingement syndrome of left shoulder: Secondary | ICD-10-CM | POA: Diagnosis not present

## 2016-06-06 DIAGNOSIS — J019 Acute sinusitis, unspecified: Secondary | ICD-10-CM | POA: Diagnosis not present

## 2016-08-02 DIAGNOSIS — M25562 Pain in left knee: Secondary | ICD-10-CM | POA: Diagnosis not present

## 2016-08-02 DIAGNOSIS — M1712 Unilateral primary osteoarthritis, left knee: Secondary | ICD-10-CM | POA: Diagnosis not present

## 2016-08-02 DIAGNOSIS — S838X2A Sprain of other specified parts of left knee, initial encounter: Secondary | ICD-10-CM | POA: Diagnosis not present

## 2017-01-14 DIAGNOSIS — Z0000 Encounter for general adult medical examination without abnormal findings: Secondary | ICD-10-CM | POA: Diagnosis not present

## 2017-01-14 DIAGNOSIS — E784 Other hyperlipidemia: Secondary | ICD-10-CM | POA: Diagnosis not present

## 2017-01-14 DIAGNOSIS — Z125 Encounter for screening for malignant neoplasm of prostate: Secondary | ICD-10-CM | POA: Diagnosis not present

## 2017-01-21 DIAGNOSIS — R51 Headache: Secondary | ICD-10-CM | POA: Diagnosis not present

## 2017-01-21 DIAGNOSIS — E668 Other obesity: Secondary | ICD-10-CM | POA: Diagnosis not present

## 2017-01-21 DIAGNOSIS — K5900 Constipation, unspecified: Secondary | ICD-10-CM | POA: Diagnosis not present

## 2017-01-21 DIAGNOSIS — Z0000 Encounter for general adult medical examination without abnormal findings: Secondary | ICD-10-CM | POA: Diagnosis not present

## 2017-01-21 DIAGNOSIS — E781 Pure hyperglyceridemia: Secondary | ICD-10-CM | POA: Diagnosis not present

## 2017-01-21 DIAGNOSIS — F325 Major depressive disorder, single episode, in full remission: Secondary | ICD-10-CM | POA: Diagnosis not present

## 2017-01-21 DIAGNOSIS — G4700 Insomnia, unspecified: Secondary | ICD-10-CM | POA: Diagnosis not present

## 2017-01-21 DIAGNOSIS — Z6833 Body mass index (BMI) 33.0-33.9, adult: Secondary | ICD-10-CM | POA: Diagnosis not present

## 2017-05-22 DIAGNOSIS — J0141 Acute recurrent pansinusitis: Secondary | ICD-10-CM | POA: Diagnosis not present

## 2017-06-06 DIAGNOSIS — J310 Chronic rhinitis: Secondary | ICD-10-CM | POA: Diagnosis not present

## 2017-06-10 DIAGNOSIS — M1712 Unilateral primary osteoarthritis, left knee: Secondary | ICD-10-CM | POA: Diagnosis not present

## 2017-06-10 DIAGNOSIS — H35032 Hypertensive retinopathy, left eye: Secondary | ICD-10-CM | POA: Diagnosis not present

## 2017-07-01 DIAGNOSIS — H348322 Tributary (branch) retinal vein occlusion, left eye, stable: Secondary | ICD-10-CM | POA: Diagnosis not present

## 2017-07-01 DIAGNOSIS — H43391 Other vitreous opacities, right eye: Secondary | ICD-10-CM | POA: Diagnosis not present

## 2017-07-01 DIAGNOSIS — H43813 Vitreous degeneration, bilateral: Secondary | ICD-10-CM | POA: Diagnosis not present

## 2017-07-01 DIAGNOSIS — H35033 Hypertensive retinopathy, bilateral: Secondary | ICD-10-CM | POA: Diagnosis not present

## 2017-08-26 DIAGNOSIS — L03116 Cellulitis of left lower limb: Secondary | ICD-10-CM | POA: Diagnosis not present

## 2017-08-27 DIAGNOSIS — W57XXXA Bitten or stung by nonvenomous insect and other nonvenomous arthropods, initial encounter: Secondary | ICD-10-CM | POA: Diagnosis not present

## 2017-08-27 DIAGNOSIS — Z6834 Body mass index (BMI) 34.0-34.9, adult: Secondary | ICD-10-CM | POA: Diagnosis not present

## 2017-08-27 DIAGNOSIS — R58 Hemorrhage, not elsewhere classified: Secondary | ICD-10-CM | POA: Diagnosis not present

## 2017-09-09 DIAGNOSIS — Z6833 Body mass index (BMI) 33.0-33.9, adult: Secondary | ICD-10-CM | POA: Diagnosis not present

## 2017-09-09 DIAGNOSIS — R21 Rash and other nonspecific skin eruption: Secondary | ICD-10-CM | POA: Diagnosis not present

## 2017-09-09 DIAGNOSIS — B001 Herpesviral vesicular dermatitis: Secondary | ICD-10-CM | POA: Diagnosis not present

## 2017-10-25 DIAGNOSIS — M25562 Pain in left knee: Secondary | ICD-10-CM | POA: Diagnosis not present

## 2017-10-25 DIAGNOSIS — M1712 Unilateral primary osteoarthritis, left knee: Secondary | ICD-10-CM | POA: Diagnosis not present

## 2017-12-22 DIAGNOSIS — S61401A Unspecified open wound of right hand, initial encounter: Secondary | ICD-10-CM | POA: Diagnosis not present

## 2018-01-06 DIAGNOSIS — H35033 Hypertensive retinopathy, bilateral: Secondary | ICD-10-CM | POA: Diagnosis not present

## 2018-01-06 DIAGNOSIS — H43391 Other vitreous opacities, right eye: Secondary | ICD-10-CM | POA: Diagnosis not present

## 2018-01-06 DIAGNOSIS — H348322 Tributary (branch) retinal vein occlusion, left eye, stable: Secondary | ICD-10-CM | POA: Diagnosis not present

## 2018-01-06 DIAGNOSIS — H43813 Vitreous degeneration, bilateral: Secondary | ICD-10-CM | POA: Diagnosis not present

## 2018-01-15 DIAGNOSIS — Z125 Encounter for screening for malignant neoplasm of prostate: Secondary | ICD-10-CM | POA: Diagnosis not present

## 2018-01-15 DIAGNOSIS — R82998 Other abnormal findings in urine: Secondary | ICD-10-CM | POA: Diagnosis not present

## 2018-01-15 DIAGNOSIS — E7849 Other hyperlipidemia: Secondary | ICD-10-CM | POA: Diagnosis not present

## 2018-01-21 DIAGNOSIS — Z1212 Encounter for screening for malignant neoplasm of rectum: Secondary | ICD-10-CM | POA: Diagnosis not present

## 2018-01-22 DIAGNOSIS — K649 Unspecified hemorrhoids: Secondary | ICD-10-CM | POA: Diagnosis not present

## 2018-01-22 DIAGNOSIS — F330 Major depressive disorder, recurrent, mild: Secondary | ICD-10-CM | POA: Diagnosis not present

## 2018-01-22 DIAGNOSIS — Z23 Encounter for immunization: Secondary | ICD-10-CM | POA: Diagnosis not present

## 2018-01-22 DIAGNOSIS — Z1389 Encounter for screening for other disorder: Secondary | ICD-10-CM | POA: Diagnosis not present

## 2018-01-22 DIAGNOSIS — M1711 Unilateral primary osteoarthritis, right knee: Secondary | ICD-10-CM | POA: Diagnosis not present

## 2018-01-22 DIAGNOSIS — E668 Other obesity: Secondary | ICD-10-CM | POA: Diagnosis not present

## 2018-01-22 DIAGNOSIS — M1712 Unilateral primary osteoarthritis, left knee: Secondary | ICD-10-CM | POA: Diagnosis not present

## 2018-01-22 DIAGNOSIS — E7849 Other hyperlipidemia: Secondary | ICD-10-CM | POA: Diagnosis not present

## 2018-01-22 DIAGNOSIS — G4709 Other insomnia: Secondary | ICD-10-CM | POA: Diagnosis not present

## 2018-01-22 DIAGNOSIS — K5909 Other constipation: Secondary | ICD-10-CM | POA: Diagnosis not present

## 2018-01-22 DIAGNOSIS — R51 Headache: Secondary | ICD-10-CM | POA: Diagnosis not present

## 2018-01-22 DIAGNOSIS — Z6832 Body mass index (BMI) 32.0-32.9, adult: Secondary | ICD-10-CM | POA: Diagnosis not present

## 2018-01-22 DIAGNOSIS — Z0000 Encounter for general adult medical examination without abnormal findings: Secondary | ICD-10-CM | POA: Diagnosis not present

## 2018-05-10 DIAGNOSIS — L258 Unspecified contact dermatitis due to other agents: Secondary | ICD-10-CM | POA: Diagnosis not present

## 2018-05-10 DIAGNOSIS — J018 Other acute sinusitis: Secondary | ICD-10-CM | POA: Diagnosis not present

## 2018-06-03 DIAGNOSIS — Z6832 Body mass index (BMI) 32.0-32.9, adult: Secondary | ICD-10-CM | POA: Diagnosis not present

## 2018-06-03 DIAGNOSIS — R51 Headache: Secondary | ICD-10-CM | POA: Diagnosis not present

## 2018-06-03 DIAGNOSIS — K5909 Other constipation: Secondary | ICD-10-CM | POA: Diagnosis not present

## 2018-06-03 DIAGNOSIS — M542 Cervicalgia: Secondary | ICD-10-CM | POA: Diagnosis not present

## 2018-06-03 DIAGNOSIS — Z87891 Personal history of nicotine dependence: Secondary | ICD-10-CM | POA: Diagnosis not present

## 2018-06-03 DIAGNOSIS — R0609 Other forms of dyspnea: Secondary | ICD-10-CM | POA: Diagnosis not present

## 2018-06-03 DIAGNOSIS — J3089 Other allergic rhinitis: Secondary | ICD-10-CM | POA: Diagnosis not present

## 2018-06-03 DIAGNOSIS — R5383 Other fatigue: Secondary | ICD-10-CM | POA: Diagnosis not present

## 2018-06-05 ENCOUNTER — Other Ambulatory Visit: Payer: Self-pay | Admitting: Internal Medicine

## 2018-06-05 DIAGNOSIS — R0609 Other forms of dyspnea: Secondary | ICD-10-CM | POA: Diagnosis not present

## 2018-06-05 DIAGNOSIS — Z136 Encounter for screening for cardiovascular disorders: Secondary | ICD-10-CM | POA: Diagnosis not present

## 2018-06-05 DIAGNOSIS — R400 Somnolence: Secondary | ICD-10-CM | POA: Diagnosis not present

## 2018-06-05 DIAGNOSIS — Z0189 Encounter for other specified special examinations: Secondary | ICD-10-CM | POA: Diagnosis not present

## 2018-06-05 DIAGNOSIS — Z87891 Personal history of nicotine dependence: Secondary | ICD-10-CM

## 2018-06-12 ENCOUNTER — Ambulatory Visit
Admission: RE | Admit: 2018-06-12 | Discharge: 2018-06-12 | Disposition: A | Discharge status: Home / Self Care | Payer: Medicare Other | Source: Ambulatory Visit | Attending: Internal Medicine | Admitting: Internal Medicine

## 2018-06-12 DIAGNOSIS — Z87891 Personal history of nicotine dependence: Secondary | ICD-10-CM | POA: Diagnosis not present

## 2018-06-12 DIAGNOSIS — M1712 Unilateral primary osteoarthritis, left knee: Secondary | ICD-10-CM | POA: Diagnosis not present

## 2018-06-12 IMAGING — CT CT CHEST LUNG CANCER SCREENING LOW DOSE W/O CM
1 of 2 series · 10 of 40 positions shown, 13 images · non-contrast
Comparison: None.

CLINICAL DATA: 72-year-old asymptomatic male former smoker with 60
pack-year smoking history, quit smoking 12 years prior.

EXAM:
CT CHEST WITHOUT CONTRAST LOW-DOSE FOR LUNG CANCER SCREENING
TECHNIQUE: Multidetector CT imaging of the chest was performed following the
standard protocol without IV contrast.

[ct lung segmentation data · axial · 0.88mm/px · z∈[-352,-352]mm · 10 of 342 frames shown]
[frame 1/342  mediastinal]
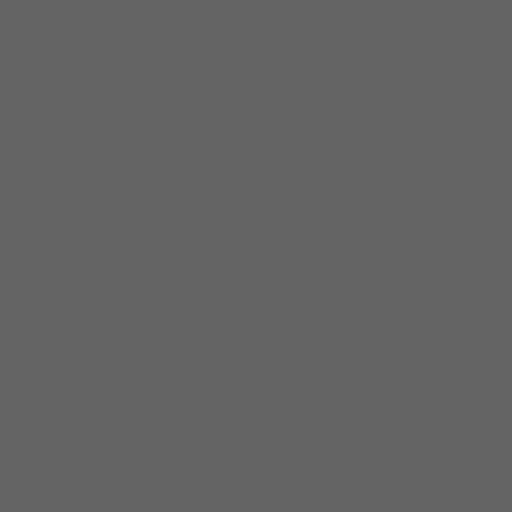
[frame 1/342  lung]
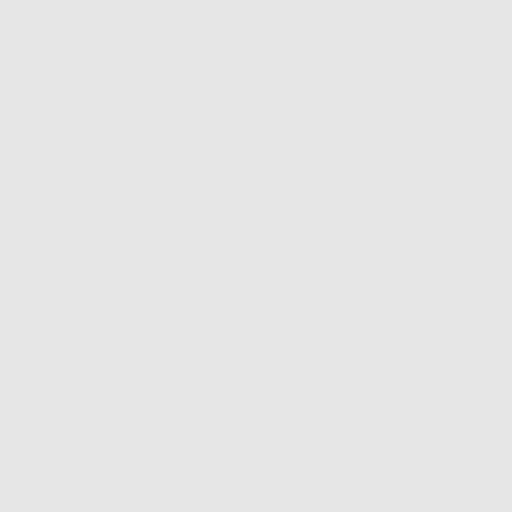
[frame 38/342  lung]
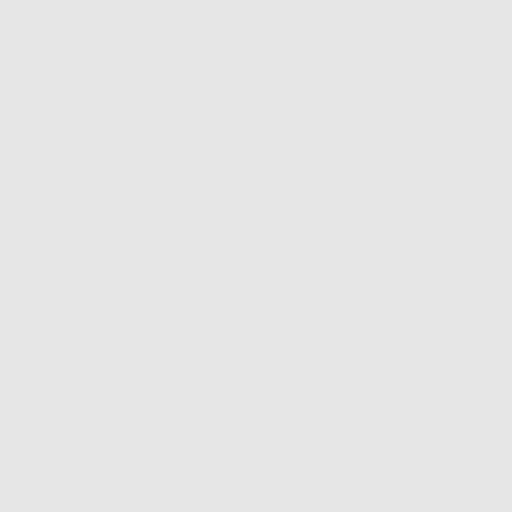
[frame 76/342  lung]
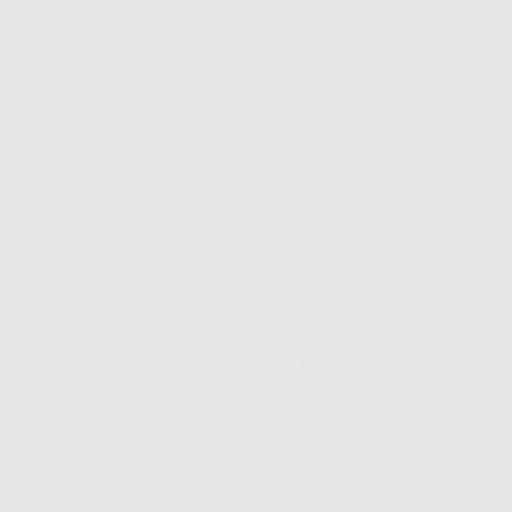
[frame 114/342  lung]
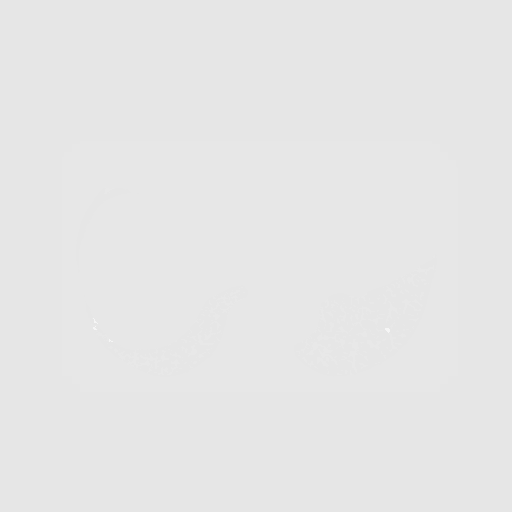
[frame 152/342  mediastinal]
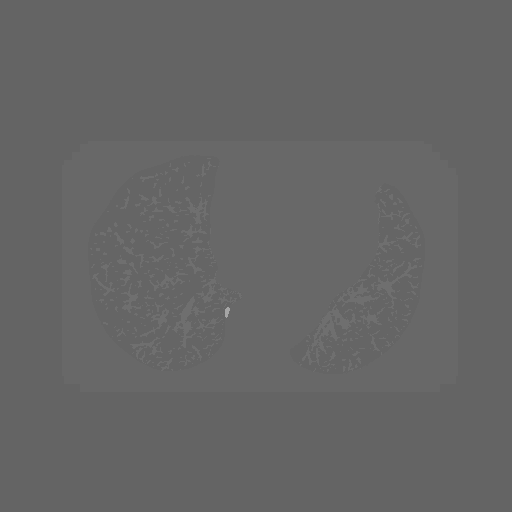
[frame 152/342  lung]
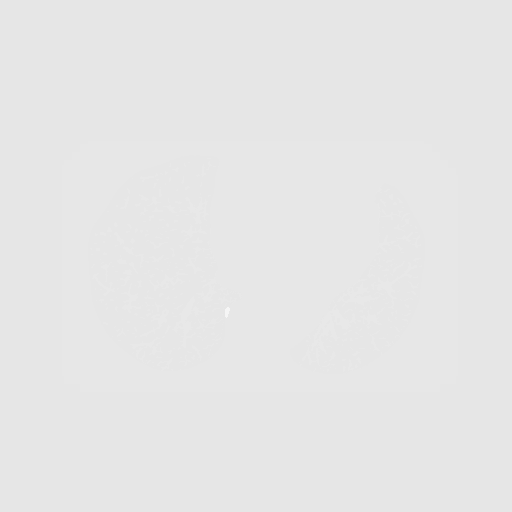
[frame 190/342  lung]
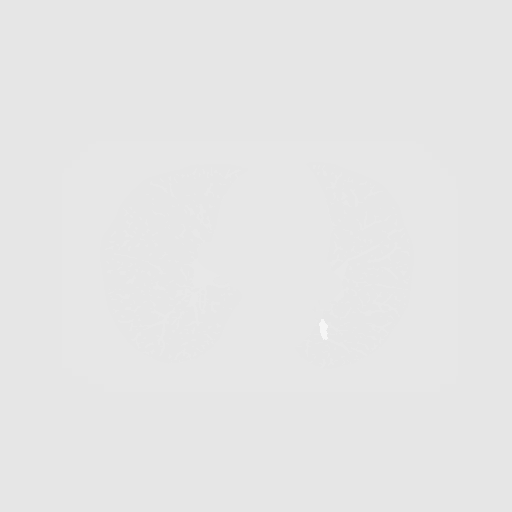
[frame 228/342  lung]
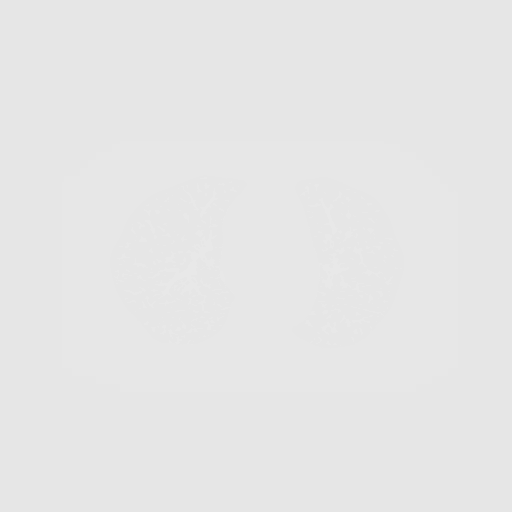
[frame 266/342  lung]
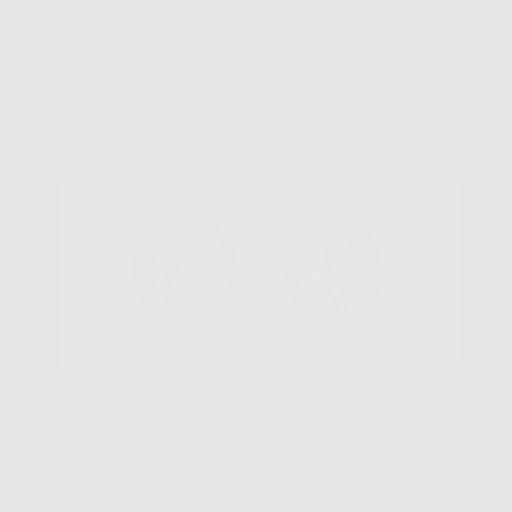
[frame 304/342  mediastinal]
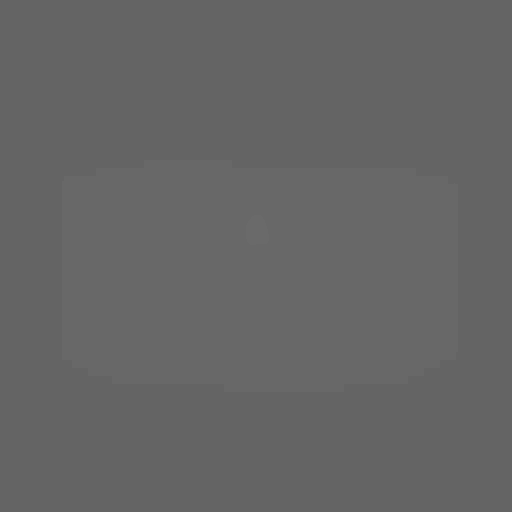
[frame 304/342  lung]
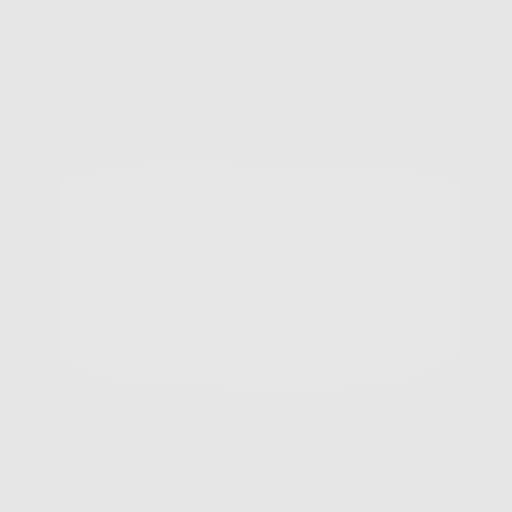
[frame 342/342  lung]
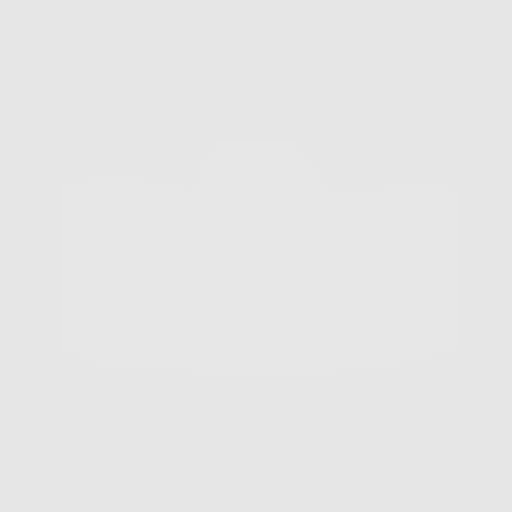

[10 of 40 positions shown; findings below may reference images not displayed]

FINDINGS: Cardiovascular: Normal heart size. No significant pericardial
effusion/thickening. Three-vessel coronary atherosclerosis.
Atherosclerotic nonaneurysmal thoracic aorta. Top-normal main
pulmonary artery (3.1 cm diameter).

Mediastinum/Nodes: No discrete thyroid nodules. Unremarkable
esophagus. No pathologically enlarged axillary, mediastinal or hilar
lymph nodes, noting limited sensitivity for the detection of hilar
adenopathy on this noncontrast study.

Lungs/Pleura: No pneumothorax. No pleural effusion. Mild
centrilobular emphysema. No acute consolidative airspace disease or
lung masses. Anterior right lower lobe solid pulmonary nodule
measuring 4.2 mm in volume derived mean diameter (series 5/image
150). No additional significant pulmonary nodules.

Upper abdomen: Diffuse hepatic steatosis.  Cholecystectomy.

Musculoskeletal: No aggressive appearing focal osseous lesions.
Mild-to-moderate thoracic spondylosis.
IMPRESSION: 1. Lung-RADS 2, benign appearance or behavior. Continue annual
screening with low-dose chest CT without contrast in 12 months.
2. Three-vessel coronary atherosclerosis.
3. Diffuse hepatic steatosis.

Aortic Atherosclerosis (Z1JDG-2AW.W) and Emphysema (Z1JDG-IRQ.Z).

## 2018-06-18 ENCOUNTER — Encounter: Payer: Self-pay | Admitting: Neurology

## 2018-06-26 DIAGNOSIS — R0602 Shortness of breath: Secondary | ICD-10-CM | POA: Diagnosis not present

## 2018-06-26 DIAGNOSIS — I10 Essential (primary) hypertension: Secondary | ICD-10-CM | POA: Diagnosis not present

## 2018-07-15 DIAGNOSIS — E7800 Pure hypercholesterolemia, unspecified: Secondary | ICD-10-CM | POA: Diagnosis not present

## 2018-07-15 DIAGNOSIS — Z136 Encounter for screening for cardiovascular disorders: Secondary | ICD-10-CM | POA: Diagnosis not present

## 2018-07-15 DIAGNOSIS — R0609 Other forms of dyspnea: Secondary | ICD-10-CM | POA: Diagnosis not present

## 2018-07-23 DIAGNOSIS — R400 Somnolence: Secondary | ICD-10-CM | POA: Diagnosis not present

## 2018-07-23 DIAGNOSIS — R0683 Snoring: Secondary | ICD-10-CM | POA: Diagnosis not present

## 2018-07-23 DIAGNOSIS — Z0189 Encounter for other specified special examinations: Secondary | ICD-10-CM | POA: Diagnosis not present

## 2018-07-23 DIAGNOSIS — R0609 Other forms of dyspnea: Secondary | ICD-10-CM | POA: Diagnosis not present

## 2018-07-31 ENCOUNTER — Encounter: Payer: Self-pay | Admitting: Neurology

## 2018-08-04 ENCOUNTER — Ambulatory Visit (INDEPENDENT_AMBULATORY_CARE_PROVIDER_SITE_OTHER): Payer: Medicare Other | Admitting: Neurology

## 2018-08-04 ENCOUNTER — Encounter: Payer: Self-pay | Admitting: Neurology

## 2018-08-04 VITALS — BP 133/88 | HR 69 | Ht 73.0 in | Wt 260.0 lb

## 2018-08-04 DIAGNOSIS — R5383 Other fatigue: Secondary | ICD-10-CM | POA: Diagnosis not present

## 2018-08-04 DIAGNOSIS — R0683 Snoring: Secondary | ICD-10-CM

## 2018-08-04 DIAGNOSIS — G4709 Other insomnia: Secondary | ICD-10-CM

## 2018-08-04 DIAGNOSIS — Z789 Other specified health status: Secondary | ICD-10-CM | POA: Diagnosis not present

## 2018-08-04 DIAGNOSIS — G478 Other sleep disorders: Secondary | ICD-10-CM | POA: Diagnosis not present

## 2018-08-04 DIAGNOSIS — G479 Sleep disorder, unspecified: Secondary | ICD-10-CM | POA: Diagnosis not present

## 2018-08-04 DIAGNOSIS — J019 Acute sinusitis, unspecified: Secondary | ICD-10-CM | POA: Diagnosis not present

## 2018-08-04 NOTE — Progress Notes (Signed)
SLEEP MEDICINE CLINIC   Provider:  Larey Seat, M.D.   Primary Care Physician:  Velna Hatchet, MD   Referring Provider: Nigel Mormon, MD    Chief Complaint  Patient presents with  . New Patient (Initial Visit)    pt alone, rm 10. pt has to take OTC medication to help with going to sleep. he wakes up at least 1 time during the night. wakes up not feeling rested and tired through out the day. last sleep study was 2016 here and was not indicative of apnea.    HPI:  Fernando Clark is a 72 y.o. male patient who is  seen here on 08-04-2018  in a referral from Dr. Virgina Jock for Sob, fatigue and Insomnia.   I had the pleasure of meeting with Fernando Clark in December 2015 at the time he also reported fatigue and sleepiness but also nocturnal snoring.  His wife had noticed that his she did not notice that he had any apneas.  He usually slept for 5 hours only at night and he shares the bed in 2015 but this with his wife but also was too large dogs.  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  Used to smoke until about 15 years ago but had quit.  He kept active playing golf, that he has in the meantime developed a new problem the inability to fall asleep even when he feels very fatigued and ready to go.  He is also divorced now he has 3 adult children 5 grandchildren, he remains a non-smoker, he drinks caffeine.  He had been evaluated all polysomnography on December 12, 2014 which revealed no significant apnea but RDI also called respiratory disturbance index.  This is snoring related upper airway resistance he arousals related to upper airway resistance he can be treated by oral appliance ENT procedure CPAP may also help but cannot be insurance approved in today's climate without the underlying diagnosis of apnea being documented.  Apnea he did not have.  I also asked to refer the patient for a sleep psychology referral since insomnia was 1 of the main  concerns for years ago already.  His cardiologist had stated that he does not sleep well, that he feels sleepy and drowsy all day long that he is highly fatigued.  He still present occasionally with higher blood pressures than desired.  His last BMI was 33.5, blood pressure on 05 June 2018 have been 154/90 mmHg.  He was screened for abdominal aortic aneurysm negative.  He had normal BUN and creatinine, glucose levels, sodium and potassium levels, vitamin D was normal platelet count was 208, hemoglobin/ hematocrit were normal levels TSH was normal. Complete echocardiogram was normal.  Chief complaint according to patient : "I just can't sleep " I haven't slept without medication in a while. My memory is poor.   Sleep habits are as follows:  Has supper without alcohol, bedtime is now between 8 and 9 PM and he may find himself still awake at 11 PM, with OTC sleep aids. He feels better when taking melatonin. Stays asleep ( in company of his large dog) until 5 AM. One bathroom break each night . Bedroom is cool , quiet and dark,, sleeps prone.  He rises at 6.30 AM. He averages 6 hours of sleep.  He is not refreshed, not restored- he dreams.  No naps in daytime.   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    Social history: Divorced after 43 years of marriage. Lives with his dog, alone. Retired  In 2017 .  Quit drinking, quit smoking, caffeine- sweet tea 3 glasses each meal.  9 glasses per day .    Review of Systems: Out of a complete 14 system review, the patient complains of only the following symptoms, and all other reviewed systems are negative.  Pressure or sinus/ valsalva headaches with alcohol,beer, wine, cocktails. Epworth score 7/ 24  , Fatigue severity score 40/63  , depression score n.a    Social History   Socioeconomic History  . Marital status: Married    Spouse name: Not on file  . Number of children: Not on file  .  Years of education: Not on file  . Highest education level: Not on file  Occupational History  . Not on file  Social Needs  . Financial resource strain: Not on file  . Food insecurity:    Worry: Not on file    Inability: Not on file  . Transportation needs:    Medical: Not on file    Non-medical: Not on file  Tobacco Use  . Smoking status: Former Smoker    Packs/day: 3.00    Types: Cigarettes    Last attempt to quit: 11/12/2005    Years since quitting: 12.7  . Smokeless tobacco: Never Used  Substance and Sexual Activity  . Alcohol use: Yes    Alcohol/week: 0.0 standard drinks    Comment: social  . Drug use: No  . Sexual activity: Not on file  Lifestyle  . Physical activity:    Days per week: Not on file    Minutes per session: Not on file  . Stress: Not on file  Relationships  . Social connections:    Talks on phone: Not on file    Gets together: Not on file    Attends religious service: Not on file    Active member of club or organization: Not on file    Attends meetings of clubs or organizations: Not on file    Relationship status: Not on file  . Intimate partner violence:    Fear of current or ex partner: Not on file    Emotionally abused: Not on file    Physically abused: Not on file    Forced sexual activity: Not on file  Other Topics Concern  . Not on file  Social History Narrative   Married, 3 kids, 5 grandkids. Retired. (farmer, Customer service manager).  Caffeine 4 cups daily.    Family History  Problem Relation Age of Onset  . Arthritis Mother   . Breast cancer Mother   . Depression Mother   . Hypertension Mother   . Depression Father   . Stroke Father   . Arthritis Sister   . Cancer Unknown        grandparent  . Prostate cancer Paternal Grandfather   . Heart attack Maternal Grandfather   . Colon cancer Neg Hx   . Rectal cancer Neg Hx   . Stomach cancer Neg Hx   . Esophageal cancer Neg Hx     Past Medical History:  Diagnosis Date  . Bursitis    R heel  .  Cataract    bilateral  . Depression    tempermental mood  . HLD (hyperlipidemia)   . Osteoarthritis    L knee  . Snoring   . Snoring 10/14/2014  Past Surgical History:  Procedure Laterality Date  . APPENDECTOMY    . CATARACT EXTRACTION    . CHOLECYSTECTOMY    . COLONOSCOPY     >10 years ago in [place] -dr nelms  . KNEE ARTHROSCOPY Left   . tonsillectomy and adnoidectomy      Current Outpatient Medications  Medication Sig Dispense Refill  . atorvastatin (LIPITOR) 20 MG tablet Take 20 mg by mouth daily.    . citalopram (CELEXA) 20 MG tablet Take 20 mg by mouth daily.    Marland Kitchen OVER THE COUNTER MEDICATION 1 tablet at bedtime. Over the counter sleep aide, pt takes before bedtime     No current facility-administered medications for this visit.     Allergies as of 08/04/2018  . (No Known Allergies)    Vitals: BP 133/88   Pulse 69   Ht 6\' 1"  (1.854 m)   Wt 260 lb (117.9 kg)   BMI 34.30 kg/m  Last Weight:  Wt Readings from Last 1 Encounters:  08/04/18 260 lb (117.9 kg)   ZOX:WRUE mass index is 34.3 kg/m.     Last Height:   Ht Readings from Last 1 Encounters:  08/04/18 6\' 1"  (1.854 m)    Physical exam:  General: The patient is awake, alert and appears not in acute distress. The patient is well groomed. Head: Normocephalic, atraumatic. Neck is supple. Mallampati 4  neck circumference:17. 25  Nasal airflow congested , Facial hair  evident . Retrognathia is not seen.  Cardiovascular:  Regular rate and rhythm , without  murmurs or carotid bruit, and without distended neck veins. Respiratory: Lungs are clear to auscultation. Skin:  Without evidence of edema, or rash Trunk: BMI is 34.3  The patient's posture is erect  Neurologic exam :The patient is awake and alert, oriented to place and time.   Memory subjective described as impaired Memory testing not performed in this visit. Attention span & concentration ability appears normal.  Speech is fluent,  without dysarthria,  dysphonia or aphasia.  Mood and affect are "unhappy"   Cranial nerves:  He lost part of his sense of smell- and taste, too- Pupils are equal and briskly reactive to light. Funduscopic exam without evidence of pallor or edema.  Extraocular movements  in vertical and horizontal planes intact and without nystagmus. Visual fields by finger perimetry are intact. Hearing to finger rub intact.  Facial sensation intact to fine touch. Facial motor strength is symmetric and tongue and uvula move midline. Shoulder shrug was symmetrical.  Motor exam:  Normal tone, muscle bulk and symmetric strength in all extremities. Knee pain.  Sensory:  Fine touch, pinprick and vibration were tested in all extremities. Proprioception tested in the upper extremities was normal. Coordination: Rapid alternating movements/ Finger-to-nose maneuver  normal without evidence of ataxia, dysmetria or tremor. Gait and station: Patient walks without assistive device.  Strength within normal limits. Stance is stable and normal.  Turns with 3 Steps. Romberg testing is negative. Deep tendon reflexes: in the upper and lower extremities are attenuated - symmetric - Babinski maneuver response is downgoing.   Assessment:  After physical and neurologic examination, review of laboratory studies,  Personal review of polysomnography from 2015 and / or neurophysiology testing and pre-existing records as far as provided in visit. 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 He is a bit ornery, and not likely to change his habits . I am going to re evaluate him by attended sleep study.  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 He snorts. His nasal septum is deviated. He has gained further weight, his nasal airway is congested, he has facial hair.    3) He has felt less depressed since his divorce- his GDS is from 7 down to 2, but fatigue has stayed.    The patient was advised of the nature of the diagnosed  disorder , the treatment options and the  risks for general health and wellness arising from not treating the condition.   I spent more than 45 minutes of face to face time with the patient. Greater than 50% of time was spent in counseling and coordination of care. We have discussed the diagnosis and differential and I answered the patient's questions.    Plan:  Treatment plan and additional workup :  attended sleep study for increased fatigue, difficulties focussing.  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 Insomnia will need to be addressed with behavioral health.    Larey Seat, MD 11/17/2692, 8:54 PM  Certified in Neurology by ABPN Certified in [place] by [facility] [address] [place], [place] [address]

## 2018-08-11 ENCOUNTER — Ambulatory Visit (INDEPENDENT_AMBULATORY_CARE_PROVIDER_SITE_OTHER): Payer: Medicare Other | Admitting: Neurology

## 2018-08-11 ENCOUNTER — Encounter

## 2018-08-11 ENCOUNTER — Encounter: Payer: Self-pay | Admitting: Neurology

## 2018-08-11 VITALS — BP 136/92 | HR 67 | Ht 73.0 in | Wt 257.0 lb

## 2018-08-11 DIAGNOSIS — R51 Headache: Secondary | ICD-10-CM | POA: Diagnosis not present

## 2018-08-11 DIAGNOSIS — R519 Headache, unspecified: Secondary | ICD-10-CM

## 2018-08-11 DIAGNOSIS — Z820 Family history of epilepsy and other diseases of the nervous system: Secondary | ICD-10-CM | POA: Diagnosis not present

## 2018-08-11 NOTE — Patient Instructions (Addendum)
I think your doctors are concerned that your father and grandmother both died of an aneurysm and they want me to clear you for surgery in case you may have one.  We will check CTA of head.  Even if you have an aneurysm, that would typically not preclude you from having surgery.  We have sent a referral to [facility] for your CTA and they will call you directly to schedule your appt. They are located at [place]. If you need to contact them directly please call [phone].
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
## 2018-08-17 ENCOUNTER — Ambulatory Visit (INDEPENDENT_AMBULATORY_CARE_PROVIDER_SITE_OTHER): Payer: Medicare Other | Admitting: Neurology

## 2018-08-17 DIAGNOSIS — G4709 Other insomnia: Secondary | ICD-10-CM

## 2018-08-17 DIAGNOSIS — R5383 Other fatigue: Secondary | ICD-10-CM

## 2018-08-17 DIAGNOSIS — G478 Other sleep disorders: Secondary | ICD-10-CM

## 2018-08-17 DIAGNOSIS — G4733 Obstructive sleep apnea (adult) (pediatric): Secondary | ICD-10-CM

## 2018-08-17 DIAGNOSIS — G479 Sleep disorder, unspecified: Secondary | ICD-10-CM

## 2018-08-17 DIAGNOSIS — Z789 Other specified health status: Secondary | ICD-10-CM

## 2018-08-17 DIAGNOSIS — R0683 Snoring: Secondary | ICD-10-CM

## 2018-08-18 ENCOUNTER — Ambulatory Visit
Admission: RE | Admit: 2018-08-18 | Discharge: 2018-08-18 | Disposition: A | Discharge status: Home / Self Care | Payer: Medicare Other | Source: Ambulatory Visit | Attending: Neurology | Admitting: Neurology

## 2018-08-18 DIAGNOSIS — Z820 Family history of epilepsy and other diseases of the nervous system: Secondary | ICD-10-CM

## 2018-08-18 DIAGNOSIS — Z8249 Family history of ischemic heart disease and other diseases of the circulatory system: Secondary | ICD-10-CM | POA: Diagnosis not present

## 2018-08-18 DIAGNOSIS — Z01818 Encounter for other preprocedural examination: Secondary | ICD-10-CM | POA: Diagnosis not present

## 2018-08-18 IMAGING — CT CT ANGIO HEAD
2 of 5 series · 5 of 30 positions shown · IV contrast (iopamidol)
Comparison: None.

CLINICAL DATA: 72-year-old male with family history of cerebral
hemorrhage, possibly cerebral aneurysm. Preoperative clearance for
knee surgery.

Creatinine was obtained on site at [HOSPITAL] at [HOSPITAL].
Results: Creatinine 0.8 mg/dL.
EXAM:
CT ANGIOGRAPHY HEAD
TECHNIQUE: Multidetector CT imaging of the head was performed using the
standard protocol during bolus administration of intravenous
contrast. Multiplanar CT image reconstructions and MIPs were
obtained to evaluate the vascular anatomy.
CONTRAST:  75mL [id number] IOPAMIDOL ([id number]-6U1) INJECTION 76%

[Series 3: head w/(date) · axial · 0.43mm/px · z∈[-119,-59]mm · 2 of 37 slices shown]
[im 13/37  brain]
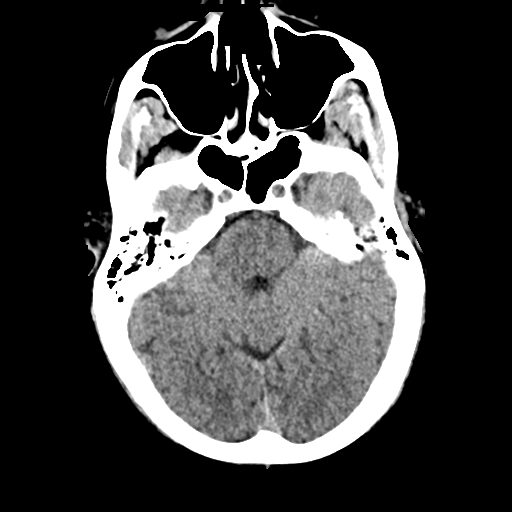
[im 25/37  brain]
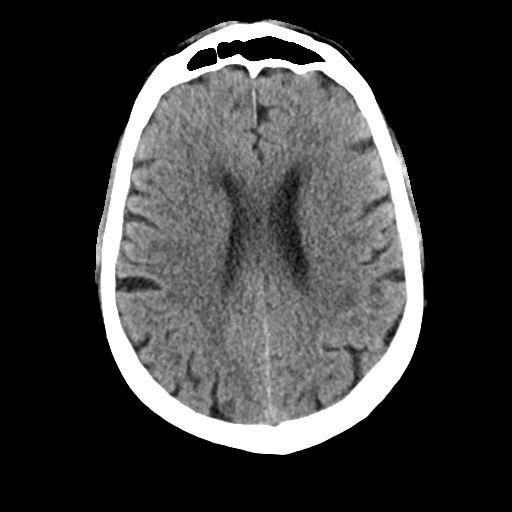

[Series 6: head angio · axial · 0.43mm/px · z∈[-134,-44]mm · 3 of 61 slices shown]
[im 16/61  brain]
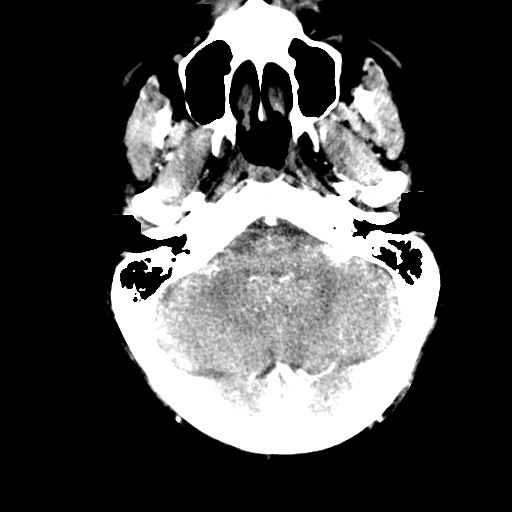
[im 31/61  bone]
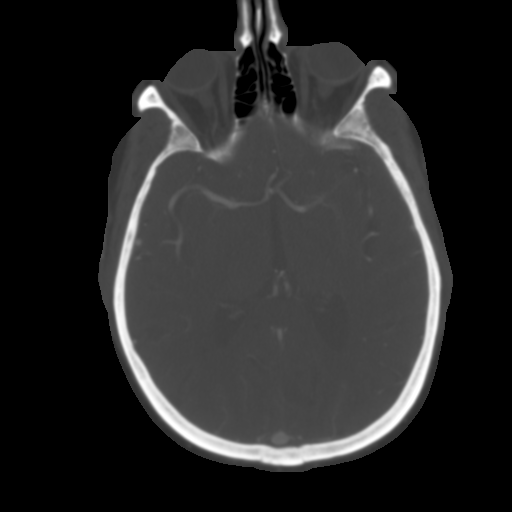
[im 46/61  brain]
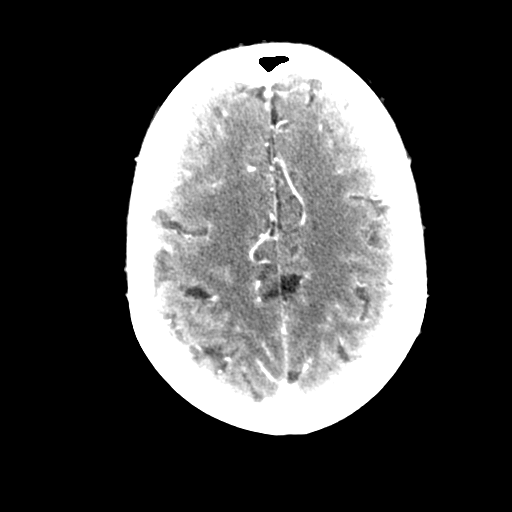

[5 of 30 positions shown; findings below may reference images not displayed]

FINDINGS: CT HEAD

Brain: Cerebral volume is within normal limits for age. Minimal
white matter hypodensity, otherwise normal gray-white matter
differentiation.

No midline shift, ventriculomegaly, mass effect, evidence of mass
lesion, intracranial hemorrhage or evidence of cortically based
acute infarction. No cortical encephalomalacia identified.

Calvarium and skull base: Negative.

Paranasal sinuses: Clear aside from minimal right sphenoid sinus
mucosal thickening.

Orbits: Postoperative changes to both globes, otherwise negative
orbits soft tissues. Visualized scalp soft tissues are within normal
limits. Negative visible deep soft tissue spaces of the face.

CTA HEAD

Posterior circulation:

Codominant distal vertebral arteries are normal to the
vertebrobasilar junction. Patent PICA origins. Normal basilar
artery, SCA and left PCA origins. Fetal type right PCA origin
(normal variant).. Left Posterior communicating artery is diminutive
or absent. bilateral PCA branches are within normal limits.

Anterior circulation:

Negative distal cervical ICAs. Patent ICA siphons with no
atherosclerosis or stenosis. Normal ophthalmic and right posterior
communicating artery origin. Normal carotid termini. Normal MCA and
ACA origins.

The left A1 is mildly dominant. The anterior communicating artery is
mildly ectatic but without aneurysmal change. Bilateral ACA branches
are also mildly tortuous but otherwise normal. There is a median
artery of the corpus callosum (normal variant).

Left MCA M1 segment, bifurcation, and left MCA branches are normal.
Right MCA M1 segment, bifurcation, and right MCA branches are within
normal limits.

Venous sinuses: Patent.

Anatomic variants: Fetal type right PCA origin. Dominant left A1
segment. Median artery of the corpus callosum.

Delayed phase: No abnormal enhancement identified.

Review of the MIP images confirms the above findings
IMPRESSION: CT Head and intracranial CTA are normal for age.

There is no atherosclerosis or stenosis. Occasional tortuosity of
the intracranial arteries.

## 2018-08-18 MED ORDER — IOPAMIDOL (ISOVUE-370) INJECTION 76%
75.0000 mL | Freq: Once | INTRAVENOUS | Status: AC | PRN
  Administered 2018-08-18: 75 mL via INTRAVENOUS

## 2018-08-20 ENCOUNTER — Telehealth: Payer: Self-pay | Admitting: *Deleted

## 2018-08-20 ENCOUNTER — Telehealth: Payer: Self-pay | Admitting: Neurology

## 2018-08-20 NOTE — Telephone Encounter (Signed)
I called pt. I advised pt that Dr. Brett Fairy reviewed their sleep study results and found that has mild sleep apnea and recommends that pt be treated with a cpap. Dr. Brett Fairy recommends that pt return for a repeat sleep study in order to properly titrate the cpap and ensure a good mask fit. Pt is agreeable to returning for a titration study. I advised pt that our sleep lab will file with pt's insurance and call pt to schedule the sleep study when we hear back from the pt's insurance regarding coverage of this sleep study. Advised the patient if he finds he is unable to tolerate the CPAP then we could look at other alternatives for treatment but that CPAP would be her first recommendation if he is able to tolerate.  Pt verbalized understanding of results. Pt had no questions at this time but was encouraged to call back if questions arise.

## 2018-08-20 NOTE — Telephone Encounter (Signed)
-----   Message from Chester Holstein, LPN sent at 71/12/5269  3:02 PM EDT -----   ----- Message ----- From: Pieter Partridge, DO Sent: 08/18/2018  12:01 PM EDT To: Clois Comber, CMA  CTA of head looks okay.  No evidence of aneurysm.  We will send clearance to his orthopedist for knee surgery

## 2018-08-20 NOTE — Telephone Encounter (Signed)
Surgical clearance faxed  

## 2018-08-20 NOTE — Addendum Note (Signed)
Addended by: Larey Seat on: 08/20/2018 08:19 AM   Modules accepted: Orders

## 2018-08-20 NOTE — Telephone Encounter (Signed)
Opened in error

## 2018-08-20 NOTE — Telephone Encounter (Signed)
-----   Message from Larey Seat, MD sent at 08/20/2018  8:17 AM EDT ----- AHI consistent with mild OSA, and additional snoring arousals. This may not explain all the sleepiness, but some. I would prefer to see the patient return for CPAP titration- If CPAP cannot be tolerated, will consider dental device or ENT procedure. BMI of under 35 allows for INSPIRE procedure, too.

## 2018-08-20 NOTE — Procedures (Signed)
PATIENT'S NAME:  Fernando Clark DOB:      [birth year]      MR#:    [id number]     DATE OF RECORDING: 08/17/2018 REFERRING M.D.:  Virgina Jock, MD Study Performed:   Baseline Polysomnogram HISTORY: Fernando Clark is a 72 y.o. male patient who was seen on 08-04-2018 in a referral from Dr. Virgina Jock for shortness of breath, fatigue and Insomnia.    I had the pleasure of meeting with [name] in December 2015 when he also reported fatigue and sleepiness and also nocturnal snoring at that time.  His wife had not reported any apneas.  He usually slept for 5 hours only at night. In 2015 his wife and also two large dogs shared his bed.  Used to smoke until about 15 years ago.  He kept active playing golf, he has in the meantime developed a new problem the inability to fall asleep even when he feels very fatigued and ready to go to sleep.   He drinks caffeine.  He had been evaluated all polysomnography on December 12, 2014 which revealed no significant apnea but had a high RDI (also called respiratory disturbance index).  This upper airway resistance syndrome (UARS) can be treated by oral appliance ,ENT procedure, and CPAP . CPAP may no longer be insurance approved in today's guidelines without the underlying diagnosis of apnea being documented.    His cardiologist had stated that he does not sleep well, that he feels sleepy and drowsy all day long and has high BP.The patient endorsed the Epworth Sleepiness Scale at 7/24 points.   The patient's weight 260 pounds with a height of 73 (inches), resulting in a BMI of 34.5 kg/m2. The patient's neck circumference measured 17 inches.  CURRENT MEDICATIONS: Lipitor, Celexa, OTC sleep aid.   PROCEDURE:  This is a multichannel digital polysomnogram utilizing the Somnostar 11.2 system.  Electrodes and sensors were applied and monitored per AASM Specifications.   EEG, EOG, Chin and Limb EMG, were sampled at 200 Hz.  ECG, Snore and Nasal Pressure, Thermal Airflow,  Respiratory Effort, CPAP Flow and Pressure, Oximetry was sampled at 50 Hz. Digital video and audio were recorded.      BASELINE STUDY: Lights Out was at 21:21 and Lights On at 05:09.  Total recording time (TRT) was 469 minutes, with a total sleep time (TST) of 213.5 minutes.  The patient's sleep latency was 137 minutes.  REM latency was 85.5 minutes.  The sleep efficiency was poor at 45.5 %.     SLEEP ARCHITECTURE: WASO (Wake after sleep onset) was 100 minutes.  There were 35 minutes in Stage N1, 118 minutes Stage N2, 27 minutes Stage N3 and 33.5 minutes in Stage REM.  The percentage of Stage N1 was 16.4%, Stage N2 was 55.3%, Stage N3 was 12.6% and Stage R (REM sleep) was 15.7%.  RESPIRATORY ANALYSIS:  There were a total of 57 respiratory events:  18 obstructive apneas, 0 central apneas and 0 mixed apneas with 39 hypopneas with a hypopnea index of 11.0 /hour. The patient also had 0 respiratory event related arousals (RERAs).    The total APNEA/HYPOPNEA INDEX (AHI) was 16.0/hour and the total RESPIRATORY DISTURBANCE INDEX was 16.0 /hour.  0 events occurred in REM sleep and 96 events in NREM. The REM AHI was 0 /hour, versus a non-REM AHI of 19.0/h. The patient spent 50 minutes of total sleep time in the supine position and 164 minutes in non-supine. The supine AHI was 61.2/h versus a non-supine AHI  of 2.2/h.  OXYGEN SATURATION & C02:  The Wake baseline 02 saturation was 96%, with the lowest being 85%. Time spent below 89% saturation equaled 8 minutes.   PERIODIC LIMB MOVEMENTS:   The patient had a total of 84 Periodic Limb Movements.  The Periodic Limb Movement (PLM) index was 23.6 and the PLM Arousal index was 0/hour. The arousals were noted as: 5 were spontaneous, 0 were associated with PLMs, and 0 were associated with respiratory events.    Audio and video analysis did not show any abnormal or unusual movements, behaviors, phonations or vocalizations.   The patient took 1 bathroom break Snoring was  noted. EKG was in keeping with normal sinus rhythm (NSR), isolated PVCs. Post-study, the patient indicated that sleep was worse than usual.    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  1. Obstructive Sleep Apnea, Mild.  2. UARS with Primary Snoring.  RECOMMENDATIONS:  1. Advise full-night, attended, CPAP titration study to optimize therapy.    I certify that I have reviewed the entire raw data recording prior to the issuance of this report in accordance with the Standards of Accreditation of the American Academy of Sleep Medicine (AASM).   Larey Seat, MD    08-20-2018 Diplomat, American Board of Psychiatry and Neurology  Diplomat, [place] of Sleep Medicine Market researcher, [employer] [facility] at [employer]

## 2018-08-28 ENCOUNTER — Ambulatory Visit (INDEPENDENT_AMBULATORY_CARE_PROVIDER_SITE_OTHER): Payer: Medicare Other | Admitting: Neurology

## 2018-08-28 DIAGNOSIS — G478 Other sleep disorders: Secondary | ICD-10-CM

## 2018-08-28 DIAGNOSIS — G4733 Obstructive sleep apnea (adult) (pediatric): Secondary | ICD-10-CM | POA: Diagnosis not present

## 2018-08-28 DIAGNOSIS — R5383 Other fatigue: Secondary | ICD-10-CM

## 2018-08-28 DIAGNOSIS — G4709 Other insomnia: Secondary | ICD-10-CM

## 2018-08-28 DIAGNOSIS — R0683 Snoring: Secondary | ICD-10-CM

## 2018-08-28 DIAGNOSIS — G479 Sleep disorder, unspecified: Secondary | ICD-10-CM

## 2018-09-01 NOTE — Addendum Note (Signed)
Addended by: Larey Seat on: 09/01/2018 05:27 PM   Modules accepted: Orders

## 2018-09-01 NOTE — Procedures (Signed)
PATIENT'S NAME:  Elis, Sauber DOB:      [birth year]      MR#:    [id number]     DATE OF RECORDING: 08/28/2018 REFERRING M.D.:  Dr. 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 3) UARS with Primary Snoring. The patient endorsed the Epworth Sleepiness Scale at 7 points and FSS at 40/63 points.  The patient's weight 260 pounds with a height of 73 (inches), resulting in a BMI of 34.5 kg/m2. The patient's neck circumference measured 17.25 inches.  CURRENT MEDICATIONS: Lipitor, Celexa, Over the counter sleep aid.  PROCEDURE:  This is a multichannel digital polysomnogram utilizing the SomnoStar 11.2 system.  Electrodes and sensors were applied and monitored per AASM Specifications.   EEG, EOG, Chin and Limb EMG, were sampled at 200 Hz.  ECG, Snore and Nasal Pressure, Thermal Airflow, Respiratory Effort, CPAP Flow and Pressure, Oximetry was sampled at 50 Hz. Digital video and audio were recorded.      CPAP was initiated at 5 cmH20 with heated humidity per AASM split night standards and pressure was advanced to 12 cmH20 because of hypopneas, apneas and desaturations.  The patient slept 119 minutes at a PAP pressure of 12 cmH20, with a reduction of the AHI to 0.5/h and Spo2 Nadir of 92%.  Lights Out was at 21:01 and Lights On at 04:59. Total recording time (TRT) was 479 minutes, with a total sleep time (TST) of 402 minutes. The patient's sleep latency was 33.5 minutes. REM latency was 167 minutes.  The sleep efficiency was 83.9 %.    SLEEP ARCHITECTURE: WASO (Wake after sleep onset) was 67.5 minutes.  There were 38 minutes in Stage N1, 260.5 minutes Stage N2, 39 minutes Stage N3 and 64.5 minutes in Stage REM.  The percentage of Stage N1 was 9.5%,  Stage N2 was 64.8%, Stage N3 was 9.7% and Stage R (REM sleep) was 16.%.   RESPIRATORY ANALYSIS:  There was a total of 75 respiratory events: 10 obstructive apneas, 12 central apneas and 10 mixed apneas with a total of 32 apneas and an apnea index (AI) of 4.8 /hour. There were 43 hypopneas with a hypopnea index of 6.4/hour. The patient also had 0 respiratory event related arousals (RERAs).     The total APNEA/HYPOPNEA INDEX (AHI) was 11.2 /hour and the total RESPIRATORY DISTURBANCE INDEX was 11.2 /hour. 1 event occurred in REM sleep and 74 events in NREM. The REM AHI was 0.9 /hour versus a non-REM AHI of 13.2 /hour.  The patient spent 251 minutes of total sleep time in the supine position and 151 minutes in non-supine. The supine AHI was 17.4/h, versus a non-supine AHI of 0.8.  OXYGEN SATURATION & C02:  The baseline 02 saturation was 98%, with the lowest being 86%. Time spent below 89% saturation equaled 1 minute.  PERIODIC LIMB MOVEMENTS:  The patient had a total of 12 Periodic Limb Movements. The Periodic Limb Movement (PLM) index was 1.8 and the PLM Arousal index was 0.1 /hour. The arousals were noted as: 61 were spontaneous, 1 was associated with PLMs, and 50 were associated with respiratory events.  Audio and video analysis did not show any abnormal or unusual movements, behaviors, phonations or vocalizations. Snoring resolved under CPAP.  EKG was in keeping with normal sinus rhythm (  NSR) and isolated PVCs. Post-study, the patient indicated that sleep was better than usual, but longer, too.    DIAGNOSIS 1. Primary Snoring, Obstructive Sleep Apnea, and Upper Airway Resistance Syndrome all responded to CPAP therapy.  CPAP at 12 was most beneficial.  2. Non-pathologic EKG findings, PVCs.   PLANS/RECOMMENDATIONS: 1) Autotitration CPAP with a pressure range from 7-14 cm water with 2 cm EPR. The patient was fitted with a F and P model, an ESON large nasal mask.  1. Educate patient in sleep hygiene  measures. Achieve and maintain lean body weight. 2. The patient should avoid sedatives, hypnotics, and alcoholic beverage consumption before bed time 3. CPAP therapy compliance is defined as 4 hours or more of nightly use.   DISCUSSION: We will follow up with Mr. Cueto after 60 -90 days of use of the new CPAP.    A follow up appointment will be scheduled in the Sleep Clinic at [facility].   Please call [phone] with any questions.      I certify that I have reviewed the entire raw data recording prior to the issuance of this report in accordance with the Standards of Accreditation of the American Academy of Sleep Medicine (AASM)    Larey Seat, M.D.    09-01-2018  Diplomat, American Board of Psychiatry and Neurology  Diplomat, American Board of Sleep Medicine Medical Director, [place] [facility] at [facility]

## 2018-09-02 ENCOUNTER — Telehealth: Payer: Self-pay | Admitting: Neurology

## 2018-09-02 NOTE — Telephone Encounter (Signed)
-----   Message from Larey Seat, MD sent at 09/01/2018  5:27 PM EDT ----- DIAGNOSIS 1. Primary Snoring, Obstructive Sleep Apnea, and Upper Airway  Resistance Syndrome all responded to CPAP therapy. CPAP at 12  was most beneficial.  2. Non-pathologic EKG findings, PVCs.   PLANS/RECOMMENDATIONS: 1) Autotitration CPAP with a pressure  range from 7-14 cm water with 2 cm EPR. The patient was fitted  with a F and P model, an ESON large nasal mask.  1. Educate patient in sleep hygiene measures. Achieve and  maintain lean body weight. 2. The patient should avoid sedatives, hypnotics, and alcoholic  beverage consumption before bed time 3. CPAP therapy compliance is defined as 4 hours or more of  nightly use.  CPAP should be made available before scheduled elective surgery.

## 2018-09-02 NOTE — Telephone Encounter (Signed)
I called pt. I advised pt that Dr. Brett Fairy reviewed their sleep study results and found that pt tolerated the CPAP well at a pressure of 12 cm water pressure. Dr. Brett Fairy recommends that pt start a auto CPAP 7-14 cm water pressure. I reviewed PAP compliance expectations with the pt. Pt is agreeable to starting a CPAP. I advised pt that an order will be sent to a DME, [facility], and [facility] will call the pt within about one week after they file with the pt's insurance. [facility] will show the pt how to use the machine, fit for masks, and troubleshoot the CPAP if needed. A follow up appt was made for insurance purposes with Ward Givens, NP on Dec 20,2019 at 9:00 am. Pt verbalized understanding to arrive 15 minutes early and bring their CPAP. A letter with all of this information in it will be mailed to the pt as a reminder. I verified with the pt that the address we have on file is correct. Pt verbalized understanding of results. Pt had no questions at this time but was encouraged to call back if questions arise. I have sent the order to [facility] and have received confirmation that they have received the order.

## 2018-09-03 ENCOUNTER — Encounter (HOSPITAL_COMMUNITY): Payer: Self-pay

## 2018-09-03 ENCOUNTER — Encounter (HOSPITAL_COMMUNITY): Payer: Self-pay | Admitting: Physician Assistant

## 2018-09-03 DIAGNOSIS — F32A Depression, unspecified: Secondary | ICD-10-CM | POA: Diagnosis present

## 2018-09-03 DIAGNOSIS — F329 Major depressive disorder, single episode, unspecified: Secondary | ICD-10-CM | POA: Diagnosis present

## 2018-09-03 DIAGNOSIS — M1712 Unilateral primary osteoarthritis, left knee: Secondary | ICD-10-CM | POA: Diagnosis present

## 2018-09-03 NOTE — H&P (Signed)
TOTAL KNEE ADMISSION H&P  Patient is being admitted for left total knee arthroplasty.  Subjective:  Chief Complaint:left knee pain.  HPI: Fernando Clark, 72 y.o. male, has a history of pain and functional disability in the left knee due to arthritis and has failed non-surgical conservative treatments for greater than 12 weeks to includeNSAID's and/or analgesics, corticosteriod injections, viscosupplementation injections, flexibility and strengthening excercises, supervised PT with diminished ADL's post treatment, use of assistive devices, weight reduction as appropriate and activity modification.  Onset of symptoms was gradual, starting >10 years ago with gradually worsening course since that time. The patient noted prior procedures on the knee to include  arthroscopy and menisectomy on the left knee(s).  Patient currently rates pain in the left knee(s) at 10 out of 10 with activity. Patient has night pain, worsening of pain with activity and weight bearing, pain that interferes with activities of daily living, crepitus and joint swelling.  Patient has evidence of subchondral sclerosis, periarticular osteophytes and joint space narrowing by imaging studies. There is no active infection.  Patient Active Problem List   Diagnosis Date Noted  . Depression 09/03/2018  . Primary localized osteoarthritis of left knee   . UARS (upper airway resistance syndrome) 08/04/2018  . Fatigue due to sleep pattern disturbance 08/04/2018  . Other insomnia 12/22/2014  . Snoring 10/14/2014   Past Medical History:  Diagnosis Date  . Bursitis    R heel  . Cataract    bilateral  . Depression    tempermental mood  . HLD (hyperlipidemia)   . Osteoarthritis    L knee  . Primary localized osteoarthritis of left knee   . Snoring   . Snoring 10/14/2014    Past Surgical History:  Procedure Laterality Date  . APPENDECTOMY    . CATARACT EXTRACTION    . CHOLECYSTECTOMY    . COLONOSCOPY     >10 years ago in  [place] -dr nelms  . KNEE ARTHROSCOPY Left   . tonsillectomy and adnoidectomy      No current facility-administered medications for this encounter.    Current Outpatient Medications  Medication Sig Dispense Refill Last Dose  . acetaminophen (TYLENOL) 650 MG CR tablet Take 1,300 mg by mouth every 8 (eight) hours as needed for pain.     Marland Kitchen atorvastatin (LIPITOR) 20 MG tablet Take 20 mg by mouth daily.   Taking  . citalopram (CELEXA) 20 MG tablet Take 20 mg by mouth daily.   Taking  . fexofenadine (ALLEGRA) 180 MG tablet Take 180 mg by mouth daily.      No Known Allergies  Social History   Tobacco Use  . Smoking status: Former Smoker    Packs/day: 3.00    Types: Cigarettes    Last attempt to quit: 11/12/2005    Years since quitting: 12.8  . Smokeless tobacco: Never Used  Substance Use Topics  . Alcohol use: Yes    Alcohol/week: 0.0 standard drinks    Comment: social    Family History  Problem Relation Age of Onset  . Arthritis Mother        lived to 55  . Breast cancer Mother   . Depression Mother   . Hypertension Mother   . Depression Father   . Stroke Father   . Anuerysm Father   . Arthritis Sister   . Cancer Unknown        grandparent  . Prostate cancer Paternal Grandfather   . Heart attack Maternal Grandfather   . Colon  cancer Neg Hx   . Rectal cancer Neg Hx   . Stomach cancer Neg Hx   . Esophageal cancer Neg Hx      Review of Systems  Constitutional: Negative.   HENT: Negative.   Eyes: Negative.   Respiratory: Negative.   Cardiovascular: Negative.   Gastrointestinal: Negative.   Genitourinary: Negative.   Musculoskeletal: Positive for back pain and joint pain.  Skin: Negative.   Neurological: Negative.   Endo/Heme/Allergies: Negative.   Psychiatric/Behavioral: Negative.     Objective:  Physical Exam  Constitutional: He is oriented to person, place, and time. He appears well-developed and well-nourished.  HENT:  Head: Normocephalic and atraumatic.   Mouth/Throat: Oropharynx is clear and moist.  Eyes: Pupils are equal, round, and reactive to light. Conjunctivae are normal.  Neck: Neck supple.  Cardiovascular: Normal rate.  Respiratory: Effort normal.  GI: Soft.  Genitourinary:  Genitourinary Comments: Not pertinent to current symptomatology therefore not examined.  Musculoskeletal:  Examination of his left knee reveals pain medially and laterally.  1+ crepitation.  1+ synovitis.  Mild varus deformity.  Range of motion -5 to 125 degrees.  Knee is stable with normal patella tracking.  Examination of the right knee reveals 1+ crepitation.  1+ synovitis.  Mild pain.  Full range of motion.  Knee is stable with normal patella tracking.    Neurological: He is alert and oriented to person, place, and time.  Skin: Skin is warm and dry.  Psychiatric: He has a normal mood and affect. His behavior is normal.    Vital signs in last 24 hours: Temp:  [98 F (36.7 C)] 98 F (36.7 C) (10/23 1400) Pulse Rate:  [72] 72 (10/23 1400) BP: (131)/(87) 131/87 (10/23 1400) SpO2:  [96 %] 96 % (10/23 1400) Weight:  [117.4 kg] 117.4 kg (10/23 1400)  Labs:   Estimated body mass index is 34.14 kg/m as calculated from the following:   Height as of this encounter: 6\' 1"  (1.854 m).   Weight as of this encounter: 117.4 kg.   Imaging Review Plain radiographs demonstrate severe degenerative joint disease of the left knee(s). The overall alignment issignificant varus. The bone quality appears to be good for age and reported activity level.   Preoperative templating of the joint replacement has been completed, documented, and submitted to the Operating Room personnel in order to optimize intra-operative equipment management.    Patient's anticipated LOS is less than 2 midnights, meeting these requirements: - Younger than 52 - Lives within 1 hour of care - Has a competent adult at home to recover with post-op recover - NO history of  - Chronic pain  requiring opiods  - Diabetes  - Coronary Artery Disease  - Heart failure  - Heart attack  - Stroke  - DVT/VTE  - Cardiac arrhythmia  - Respiratory Failure/COPD  - Renal failure  - Anemia  - Advanced Liver disease        Assessment/Plan:  End stage arthritis, left knee   The patient history, physical examination, clinical judgment of the provider and imaging studies are consistent with end stage degenerative joint disease of the left knee(s) and total knee arthroplasty is deemed medically necessary. The treatment options including medical management, injection therapy arthroscopy and arthroplasty were discussed at length. The risks and benefits of total knee arthroplasty were presented and reviewed. The risks due to aseptic loosening, infection, stiffness, patella tracking problems, thromboembolic complications and other imponderables were discussed. The patient acknowledged the explanation, agreed to proceed  with the plan and consent was signed. Patient is being admitted for inpatient treatment for surgery, pain control, PT, OT, prophylactic antibiotics, VTE prophylaxis, progressive ambulation and ADL's and discharge planning. The patient is planning to be discharged home with home health services

## 2018-09-03 NOTE — Pre-Procedure Instructions (Signed)
Fernando Clark  09/03/2018      [id number] - [place], [place] - [address] AT [address] [place] [address] Phone: [phone] Fax: [phone]    Your procedure is scheduled on November 4th.  Report to [facility] at 0800 A.M.  Call this number if you have problems the morning of surgery:  [phone]   Remember:  Do not eat or drink after midnight.    Take these medicines the morning of surgery with A SIP OF WATER   acetaminophen (TYLENOL) (if needed)  atorvastatin (LIPITOR)  citalopram (CELEXA)  fexofenadine (ALLEGRA)  7 days prior to surgery STOP taking any Aspirin(unless otherwise instructed by your surgeon), Aleve, Naproxen, Ibuprofen, Motrin, Advil, Goody's, BC's, all herbal medications, fish oil, and all vitamins    Do not wear jewelry.  Do not wear lotions, powders, or colognes, or deodorant.  Men may shave face and neck.  Do not bring valuables to the hospital.  [facility] is not responsible for any belongings or valuables.  Contacts, dentures or bridgework may not be worn into surgery.  Leave your suitcase in the car.  After surgery it may be brought to your room.  For patients admitted to the hospital, discharge time will be determined by your treatment team.  Patients discharged the day of surgery will not be allowed to drive home.    [facility]- Preparing For Surgery  Before surgery, you can play an important role. Because skin is not sterile, your skin needs to be as free of germs as possible. You can reduce the number of germs on your skin by washing with CHG (chlorahexidine gluconate) Soap before surgery.  CHG is an antiseptic cleaner which kills germs and bonds with the skin to continue killing germs even after washing.    Oral Hygiene is also important to reduce your risk of infection.  Remember - BRUSH YOUR TEETH THE MORNING OF SURGERY WITH YOUR REGULAR  TOOTHPASTE  Please do not use if you have an allergy to CHG or antibacterial soaps. If your skin becomes reddened/irritated stop using the CHG.  Do not shave (including legs and underarms) for at least 48 hours prior to first CHG shower. It is OK to shave your face.  Please follow these instructions carefully.   1. Shower the NIGHT BEFORE SURGERY and the MORNING OF SURGERY with CHG.   2. If you chose to wash your hair, wash your hair first as usual with your normal shampoo.  3. After you shampoo, rinse your hair and body thoroughly to remove the shampoo.  4. Use CHG as you would any other liquid soap. You can apply CHG directly to the skin and wash gently with a scrungie or a clean washcloth.   5. Apply the CHG Soap to your body ONLY FROM THE NECK DOWN.  Do not use on open wounds or open sores. Avoid contact with your eyes, ears, mouth and genitals (private parts). Wash Face and genitals (private parts)  with your normal soap.  6. Wash thoroughly, paying special attention to the area where your surgery will be performed.  7. Thoroughly rinse your body with warm water from the neck down.  8. DO NOT shower/wash with your normal soap after using and rinsing off the CHG Soap.  9. Pat yourself dry with a CLEAN TOWEL.  10. Wear CLEAN PAJAMAS to bed the night before surgery, wear comfortable clothes the morning of surgery  11. Place CLEAN SHEETS on your bed the night of your first shower and DO NOT SLEEP WITH PETS.    Day of Surgery:  Do not apply any deodorants/lotions.  Please wear clean clothes to the hospital/surgery center.   Remember to brush your teeth WITH YOUR REGULAR TOOTHPASTE.    Please read over the following fact sheets that you were given.

## 2018-09-04 ENCOUNTER — Encounter (HOSPITAL_COMMUNITY)
Admission: RE | Admit: 2018-09-04 | Discharge: 2018-09-04 | Disposition: A | Discharge status: Home / Self Care | Payer: Medicare Other | Source: Ambulatory Visit | Attending: Orthopedic Surgery | Admitting: Orthopedic Surgery

## 2018-09-04 ENCOUNTER — Encounter (HOSPITAL_COMMUNITY): Payer: Self-pay

## 2018-09-04 DIAGNOSIS — Z01812 Encounter for preprocedural laboratory examination: Secondary | ICD-10-CM | POA: Insufficient documentation

## 2018-09-04 HISTORY — DX: Vasectomy status: Z98.52

## 2018-09-04 HISTORY — DX: Sleep apnea, unspecified: G47.30

## 2018-09-04 HISTORY — DX: Headache, unspecified: R51.9

## 2018-09-04 HISTORY — DX: Headache: R51

## 2018-09-04 LAB — TYPE AND SCREEN
ABO/RH(D): A POS
Antibody Screen: NEGATIVE

## 2018-09-04 LAB — COMPREHENSIVE METABOLIC PANEL
ALT: 21 U/L (ref 0–44)
AST: 21 U/L (ref 15–41)
Albumin: 4 g/dL (ref 3.5–5.0)
Alkaline Phosphatase: 55 U/L (ref 38–126)
Anion gap: 6 (ref 5–15)
BUN: 13 mg/dL (ref 8–23)
CHLORIDE: 106 mmol/L (ref 98–111)
CO2: 29 mmol/L (ref 22–32)
CREATININE: 0.76 mg/dL (ref 0.61–1.24)
Calcium: 9.3 mg/dL (ref 8.9–10.3)
Glucose, Bld: 104 mg/dL — ABNORMAL HIGH (ref 70–99)
Potassium: 3.8 mmol/L (ref 3.5–5.1)
Sodium: 141 mmol/L (ref 135–145)
TOTAL PROTEIN: 6.5 g/dL (ref 6.5–8.1)
Total Bilirubin: 0.8 mg/dL (ref 0.3–1.2)

## 2018-09-04 LAB — CBC WITH DIFFERENTIAL/PLATELET
ABS IMMATURE GRANULOCYTES: 0.03 10*3/uL (ref 0.00–0.07)
BASOS PCT: 1 %
Basophils Absolute: 0.1 10*3/uL (ref 0.0–0.1)
EOS PCT: 4 %
Eosinophils Absolute: 0.2 10*3/uL (ref 0.0–0.5)
HCT: 40.2 % (ref 39.0–52.0)
Hemoglobin: 13.3 g/dL (ref 13.0–17.0)
Immature Granulocytes: 1 %
Lymphocytes Relative: 14 %
Lymphs Abs: 0.8 10*3/uL (ref 0.7–4.0)
MCH: 30.9 pg (ref 26.0–34.0)
MCHC: 33.1 g/dL (ref 30.0–36.0)
MCV: 93.3 fL (ref 80.0–100.0)
MONO ABS: 0.5 10*3/uL (ref 0.1–1.0)
MONOS PCT: 9 %
Neutro Abs: 4.4 10*3/uL (ref 1.7–7.7)
Neutrophils Relative %: 71 %
PLATELETS: 217 10*3/uL (ref 150–400)
RBC: 4.31 MIL/uL (ref 4.22–5.81)
RDW: 12.4 % (ref 11.5–15.5)
WBC: 6 10*3/uL (ref 4.0–10.5)
nRBC: 0 % (ref 0.0–0.2)

## 2018-09-04 LAB — PROTIME-INR
INR: 1.02
PROTHROMBIN TIME: 13.3 s (ref 11.4–15.2)

## 2018-09-04 LAB — SURGICAL PCR SCREEN
MRSA, PCR: NEGATIVE
STAPHYLOCOCCUS AUREUS: POSITIVE — AB

## 2018-09-04 LAB — APTT: APTT: 31 s (ref 24–36)

## 2018-09-04 LAB — ABO/RH: ABO/RH(D): A POS

## 2018-09-05 LAB — URINE CULTURE

## 2018-09-05 NOTE — Progress Notes (Signed)
Anesthesia Chart Review:  Case:  [id number] Date/Time:  09/15/18 0941   Procedure:  TOTAL KNEE ARTHROPLASTY (Left )   Anesthesia type:  Spinal   Pre-op diagnosis:  djd left knee   Location:  [facility] OR ROOM 07 / [place] [facility]   Surgeon:  Elsie Saas, MD      DISCUSSION: Patient is a 72 year old male scheduled for the above procedure.   History includes former smoker (quit '07), HLD, OSA (CPAP). BMI is consistent with obesity.   Patient was felt okay to proceed from cardiology and neurology standpoints.   Based on currently available information, I would anticipate that he can proceed as planned if no acute changes.   VS: BP (!) 153/93   Pulse 64   Temp 36.9 C (Oral)   Ht 6\' 1"  (1.854 m)   Wt 118.5 kg   SpO2 99%   BMI 34.47 kg/m     PROVIDERS: Velna Hatchet, MD is PCP - Metta Clines, DO is neurologist. He saw patient on 08/11/18 for preoperative neurology clearance given patient's history of headaches with family history of cerebral aneurysms. He had a CTA of the head that was unremarkable. Jackquline Berlin, MD is cardiologist [facility]). Last visit 07/23/18 to review EKG and abdominal aortic U/S. No AAA noted. Mild diastolic dysfunction echo. Fatigue thought possible due to undiagnosed OSA, so sleep study recommended. PRN cardiology follow-up recommended. He felt patient could proceed with TKR at low cardiac risk.     LABS: Preoperative labs noted. Urine culture showed insignificant growth.  (all labs ordered are listed, but only abnormal results are displayed)  Labs Reviewed  SURGICAL PCR SCREEN - Abnormal; Notable for the following components:      Result Value   Staphylococcus aureus POSITIVE (*)    All other components within normal limits  URINE CULTURE - Abnormal; Notable for the following components:   Culture   (*)    Value: <10,000 COLONIES/mL INSIGNIFICANT GROWTH Performed at [facility] [address] [place], [place] [address]    All other  components within normal limits  COMPREHENSIVE METABOLIC PANEL - Abnormal; Notable for the following components:   Glucose, Bld 104 (*)    All other components within normal limits  APTT  CBC WITH DIFFERENTIAL/PLATELET  PROTIME-INR  TYPE AND SCREEN  ABO/RH    IMAGES: CTA head 08/18/18: IMPRESSION: CT Head and intracranial CTA are normal for age. There is no atherosclerosis or stenosis. Occasional tortuosity of the intracranial arteries.  CT Chest lung cancer 06/12/18: IMPRESSION: 1. Lung-RADS 2, benign appearance or behavior. Continue annual screening with low-dose chest CT without contrast in 12 months. 2. Three-vessel coronary atherosclerosis. 3. Diffuse hepatic steatosis. Aortic Atherosclerosis (ICD10-I70.0) and Emphysema (ICD10-J43.9).   EKG: 06/05/18 [facility] CV): NSR   CV: Echo 06/26/18 [facility]): Conclusions: 1.  Left ventricle cavity is normal in size.  Mild concentric hypertrophy of the left ventricle.  Normal global wall motion.  Doppler evidence of grade 1 (impaired) diastolic dysfunction, normal LAP.  Calculated EF 66%. 2.  Left atrial cavity is moderately dilated. 3.  Mild (grade 1) mitral regurgitation. 4.  Mild tricuspid regurgitation.  Inadequate tricuspid regurgitation jet to estimate pulmonary artery pressure. 5.  Normal right atrial pressure.  Abdominal aortic Duplex 07/15/18 [facility] CV): No AAA observed. The maximum aorta diameter is 2.54 cm (prox). No evidence of atherosclerotic plaque. Normal flow velocities in the iliac arteries, diffuse calcification of the aorta noted.   Past Medical History:  Diagnosis Date  . Bursitis    R heel  . Cataract    bilateral  . Depression    tempermental mood  . Headache   . HLD (hyperlipidemia)   . Hx of vasectomy   . Osteoarthritis    L knee  . Primary localized osteoarthritis of left knee   . Sleep apnea    cpap  . Snoring   . Snoring 10/14/2014    Past Surgical History:  Procedure Laterality Date   . APPENDECTOMY    . CATARACT EXTRACTION    . CHOLECYSTECTOMY    . COLONOSCOPY     >10 years ago in [place] -dr nelms  . KNEE ARTHROSCOPY Left   . tonsillectomy and adnoidectomy      MEDICATIONS: . acetaminophen (TYLENOL) 650 MG CR tablet  . atorvastatin (LIPITOR) 20 MG tablet  . citalopram (CELEXA) 20 MG tablet  . fexofenadine (ALLEGRA) 180 MG tablet   No current facility-administered medications for this encounter.     George [facility] Short Stay Center/Anesthesiology Phone [phone] 09/05/2018 4:59 PM

## 2018-09-12 ENCOUNTER — Other Ambulatory Visit: Payer: Self-pay | Admitting: Orthopedic Surgery

## 2018-09-12 MED ORDER — TRANEXAMIC ACID-NACL 1000-0.7 MG/100ML-% IV SOLN
1000.0000 mg | INTRAVENOUS | Status: AC
  Administered 2018-09-15: 1000 mg via INTRAVENOUS
  Filled 2018-09-12: qty 100

## 2018-09-12 MED ORDER — DEXTROSE 5 % IV SOLN
3.0000 g | INTRAVENOUS | Status: AC
  Administered 2018-09-15: 3 g via INTRAVENOUS
  Filled 2018-09-12: qty 3

## 2018-09-12 MED ORDER — BUPIVACAINE LIPOSOME 1.3 % IJ SUSP
20.0000 mL | INTRAMUSCULAR | Status: DC
  Filled 2018-09-12: qty 20

## 2018-09-12 NOTE — Care Plan (Signed)
Met with patient in the office. Will discharge to home with family and HHPT. Equipment ordered.

## 2018-09-14 NOTE — Anesthesia Preprocedure Evaluation (Addendum)
Anesthesia Evaluation  Patient identified by MRN, date of birth, ID band Patient awake    Reviewed: Allergy & Precautions, NPO status , Patient's Chart, lab work & pertinent test results  History of Anesthesia Complications Negative for: history of anesthetic complications  Airway Mallampati: II  TM Distance: >3 FB Neck ROM: Full    Dental no notable dental hx. (+) Teeth Intact   Pulmonary sleep apnea and Continuous Positive Airway Pressure Ventilation , former smoker,    Pulmonary exam normal breath sounds clear to auscultation       Cardiovascular negative cardio ROS Normal cardiovascular exam Rhythm:Regular Rate:Normal     Neuro/Psych  Headaches, Depression    GI/Hepatic negative GI ROS, Neg liver ROS,   Endo/Other  negative endocrine ROS  Renal/GU negative Renal ROS     Musculoskeletal  (+) Arthritis , Osteoarthritis,    Abdominal   Peds  Hematology negative hematology ROS (+)   Anesthesia Other Findings Day of surgery medications reviewed with the patient.  Reproductive/Obstetrics                            Anesthesia Physical Anesthesia Plan  ASA: II  Anesthesia Plan: Spinal   Post-op Pain Management:  Regional for Post-op pain   Induction:   PONV Risk Score and Plan: 1 and Treatment may vary due to age or medical condition, Ondansetron and Propofol infusion  Airway Management Planned: Natural Airway and Nasal Cannula  Additional Equipment:   Intra-op Plan:   Post-operative Plan:   Informed Consent: I have reviewed the patients History and Physical, chart, labs and discussed the procedure including the risks, benefits and alternatives for the proposed anesthesia with the patient or authorized representative who has indicated his/her understanding and acceptance.     Plan Discussed with: CRNA  Anesthesia Plan Comments: (Spinal + adductor canal block)        Anesthesia Quick Evaluation

## 2018-09-15 ENCOUNTER — Inpatient Hospital Stay (HOSPITAL_COMMUNITY): Payer: Medicare Other | Admitting: Vascular Surgery

## 2018-09-15 ENCOUNTER — Other Ambulatory Visit: Payer: Self-pay

## 2018-09-15 ENCOUNTER — Encounter (HOSPITAL_COMMUNITY)
Admission: RE | Disposition: A | Discharge status: Home Health | Payer: Self-pay | Source: Ambulatory Visit | Attending: Orthopedic Surgery

## 2018-09-15 ENCOUNTER — Inpatient Hospital Stay (HOSPITAL_COMMUNITY)
Admission: RE | Admit: 2018-09-15 | Discharge: 2018-09-17 | DRG: 470 | Disposition: A | Discharge status: Home Health | Payer: Medicare Other | Source: Ambulatory Visit | Attending: Orthopedic Surgery | Admitting: Orthopedic Surgery

## 2018-09-15 ENCOUNTER — Inpatient Hospital Stay (HOSPITAL_COMMUNITY): Payer: Medicare Other | Admitting: Anesthesiology

## 2018-09-15 ENCOUNTER — Encounter (HOSPITAL_COMMUNITY): Payer: Self-pay | Admitting: Orthopedic Surgery

## 2018-09-15 DIAGNOSIS — F329 Major depressive disorder, single episode, unspecified: Secondary | ICD-10-CM | POA: Diagnosis not present

## 2018-09-15 DIAGNOSIS — Z87891 Personal history of nicotine dependence: Secondary | ICD-10-CM

## 2018-09-15 DIAGNOSIS — G479 Sleep disorder, unspecified: Secondary | ICD-10-CM | POA: Diagnosis not present

## 2018-09-15 DIAGNOSIS — M1712 Unilateral primary osteoarthritis, left knee: Secondary | ICD-10-CM | POA: Diagnosis not present

## 2018-09-15 DIAGNOSIS — F32A Depression, unspecified: Secondary | ICD-10-CM | POA: Diagnosis present

## 2018-09-15 DIAGNOSIS — E785 Hyperlipidemia, unspecified: Secondary | ICD-10-CM | POA: Diagnosis not present

## 2018-09-15 DIAGNOSIS — G8918 Other acute postprocedural pain: Secondary | ICD-10-CM | POA: Diagnosis not present

## 2018-09-15 DIAGNOSIS — R110 Nausea: Secondary | ICD-10-CM | POA: Diagnosis not present

## 2018-09-15 DIAGNOSIS — G4730 Sleep apnea, unspecified: Secondary | ICD-10-CM | POA: Diagnosis not present

## 2018-09-15 DIAGNOSIS — Z79899 Other long term (current) drug therapy: Secondary | ICD-10-CM

## 2018-09-15 DIAGNOSIS — R42 Dizziness and giddiness: Secondary | ICD-10-CM | POA: Diagnosis not present

## 2018-09-15 DIAGNOSIS — Z23 Encounter for immunization: Secondary | ICD-10-CM | POA: Diagnosis not present

## 2018-09-15 DIAGNOSIS — K219 Gastro-esophageal reflux disease without esophagitis: Secondary | ICD-10-CM | POA: Diagnosis present

## 2018-09-15 DIAGNOSIS — R5383 Other fatigue: Secondary | ICD-10-CM | POA: Diagnosis present

## 2018-09-15 HISTORY — DX: Unilateral primary osteoarthritis, left knee: M17.12

## 2018-09-15 HISTORY — DX: Gastro-esophageal reflux disease without esophagitis: K21.9

## 2018-09-15 HISTORY — PX: TOTAL KNEE ARTHROPLASTY: SHX125

## 2018-09-15 HISTORY — DX: Unspecified osteoarthritis, unspecified site: M19.90

## 2018-09-15 SURGERY — ARTHROPLASTY, KNEE, TOTAL
Anesthesia: Spinal | Site: Knee | Laterality: Left

## 2018-09-15 MED ORDER — BUPIVACAINE LIPOSOME 1.3 % IJ SUSP
INTRAMUSCULAR | Status: DC | PRN
  Administered 2018-09-15: 20 mL

## 2018-09-15 MED ORDER — SODIUM CHLORIDE 0.9 % IR SOLN
Status: DC | PRN
  Administered 2018-09-15: 3000 mL

## 2018-09-15 MED ORDER — PHENYLEPHRINE 40 MCG/ML (10ML) SYRINGE FOR IV PUSH (FOR BLOOD PRESSURE SUPPORT)
PREFILLED_SYRINGE | INTRAVENOUS | Status: AC
  Filled 2018-09-15: qty 10

## 2018-09-15 MED ORDER — DEXAMETHASONE SODIUM PHOSPHATE 10 MG/ML IJ SOLN
10.0000 mg | Freq: Three times a day (TID) | INTRAMUSCULAR | Status: AC
  Administered 2018-09-15 – 2018-09-16 (×4): 10 mg via INTRAVENOUS
  Filled 2018-09-15 (×4): qty 1

## 2018-09-15 MED ORDER — MIDAZOLAM HCL 2 MG/2ML IJ SOLN
1.0000 mg | Freq: Once | INTRAMUSCULAR | Status: AC
  Administered 2018-09-15: 1 mg via INTRAVENOUS

## 2018-09-15 MED ORDER — MENTHOL 3 MG MT LOZG: 1.0000 | LOZENGE | OROMUCOSAL | Status: DC | PRN

## 2018-09-15 MED ORDER — ACETAMINOPHEN 500 MG PO TABS
1000.0000 mg | ORAL_TABLET | Freq: Four times a day (QID) | ORAL | Status: AC
  Administered 2018-09-15 – 2018-09-16 (×4): 1000 mg via ORAL
  Filled 2018-09-15 (×5): qty 2

## 2018-09-15 MED ORDER — SODIUM CHLORIDE 0.9 % IJ SOLN
INTRAMUSCULAR | Status: DC | PRN
  Administered 2018-09-15: 50 mL

## 2018-09-15 MED ORDER — METOCLOPRAMIDE HCL 5 MG/ML IJ SOLN
5.0000 mg | Freq: Three times a day (TID) | INTRAMUSCULAR | Status: DC | PRN
  Administered 2018-09-16: 10 mg via INTRAVENOUS
  Filled 2018-09-15: qty 2

## 2018-09-15 MED ORDER — CEFUROXIME SODIUM 750 MG IJ SOLR
INTRAMUSCULAR | Status: DC | PRN
  Administered 2018-09-15: 1.5 g

## 2018-09-15 MED ORDER — BUPIVACAINE-EPINEPHRINE 0.5% -1:200000 IJ SOLN
INTRAMUSCULAR | Status: AC
  Filled 2018-09-15: qty 1

## 2018-09-15 MED ORDER — MIDAZOLAM HCL 2 MG/2ML IJ SOLN
INTRAMUSCULAR | Status: AC
  Administered 2018-09-15: 1 mg via INTRAVENOUS
  Filled 2018-09-15: qty 2

## 2018-09-15 MED ORDER — DEXMEDETOMIDINE HCL IN NACL 200 MCG/50ML IV SOLN
INTRAVENOUS | Status: DC | PRN
  Administered 2018-09-15: .5 ug/kg/h via INTRAVENOUS

## 2018-09-15 MED ORDER — EPHEDRINE 5 MG/ML INJ
INTRAVENOUS | Status: AC
  Filled 2018-09-15: qty 10

## 2018-09-15 MED ORDER — DOCUSATE SODIUM 100 MG PO CAPS
100.0000 mg | ORAL_CAPSULE | Freq: Two times a day (BID) | ORAL | Status: DC
  Administered 2018-09-15 – 2018-09-17 (×4): 100 mg via ORAL
  Filled 2018-09-15 (×4): qty 1

## 2018-09-15 MED ORDER — BUPIVACAINE IN DEXTROSE 0.75-8.25 % IT SOLN
INTRATHECAL | Status: DC | PRN
  Administered 2018-09-15: 1.8 mL via INTRATHECAL

## 2018-09-15 MED ORDER — CEFAZOLIN SODIUM-DEXTROSE 2-4 GM/100ML-% IV SOLN
2.0000 g | Freq: Four times a day (QID) | INTRAVENOUS | Status: AC
  Administered 2018-09-15 – 2018-09-16 (×2): 2 g via INTRAVENOUS
  Filled 2018-09-15 (×2): qty 100

## 2018-09-15 MED ORDER — OXYCODONE HCL 5 MG PO TABS: 5.0000 mg | ORAL_TABLET | Freq: Once | ORAL | Status: DC | PRN

## 2018-09-15 MED ORDER — SUCCINYLCHOLINE CHLORIDE 200 MG/10ML IV SOSY
PREFILLED_SYRINGE | INTRAVENOUS | Status: AC
  Filled 2018-09-15: qty 10

## 2018-09-15 MED ORDER — PROMETHAZINE HCL 25 MG/ML IJ SOLN: 6.2500 mg | INTRAMUSCULAR | Status: DC | PRN

## 2018-09-15 MED ORDER — METOCLOPRAMIDE HCL 5 MG PO TABS
5.0000 mg | ORAL_TABLET | Freq: Three times a day (TID) | ORAL | Status: DC | PRN
  Filled 2018-09-15: qty 2

## 2018-09-15 MED ORDER — FENTANYL CITRATE (PF) 250 MCG/5ML IJ SOLN
INTRAMUSCULAR | Status: AC
  Filled 2018-09-15: qty 5

## 2018-09-15 MED ORDER — POLYETHYLENE GLYCOL 3350 17 G PO PACK
17.0000 g | PACK | Freq: Two times a day (BID) | ORAL | Status: DC
  Administered 2018-09-16 – 2018-09-17 (×2): 17 g via ORAL
  Filled 2018-09-15 (×4): qty 1

## 2018-09-15 MED ORDER — ONDANSETRON HCL 4 MG/2ML IJ SOLN
INTRAMUSCULAR | Status: DC | PRN
  Administered 2018-09-15: 4 mg via INTRAVENOUS

## 2018-09-15 MED ORDER — LACTATED RINGERS IV SOLN
INTRAVENOUS | Status: DC
  Administered 2018-09-15 (×3): via INTRAVENOUS

## 2018-09-15 MED ORDER — ACETAMINOPHEN 10 MG/ML IV SOLN: 1000.0000 mg | Freq: Once | INTRAVENOUS | Status: DC | PRN

## 2018-09-15 MED ORDER — BUPIVACAINE-EPINEPHRINE (PF) 0.5% -1:200000 IJ SOLN
INTRAMUSCULAR | Status: DC | PRN
  Administered 2018-09-15: 20 mL via PERINEURAL

## 2018-09-15 MED ORDER — DEXAMETHASONE SODIUM PHOSPHATE 10 MG/ML IJ SOLN
INTRAMUSCULAR | Status: DC | PRN
  Administered 2018-09-15: 10 mg via INTRAVENOUS

## 2018-09-15 MED ORDER — FENTANYL CITRATE (PF) 100 MCG/2ML IJ SOLN
INTRAMUSCULAR | Status: AC
  Administered 2018-09-15: 50 ug via INTRAVENOUS
  Filled 2018-09-15: qty 2

## 2018-09-15 MED ORDER — LIDOCAINE HCL (CARDIAC) PF 100 MG/5ML IV SOSY
PREFILLED_SYRINGE | INTRAVENOUS | Status: DC | PRN
  Administered 2018-09-15: 20 mg via INTRAVENOUS

## 2018-09-15 MED ORDER — LIDOCAINE 2% (20 MG/ML) 5 ML SYRINGE
INTRAMUSCULAR | Status: AC
  Filled 2018-09-15: qty 5

## 2018-09-15 MED ORDER — POVIDONE-IODINE 7.5 % EX SOLN
Freq: Once | CUTANEOUS | Status: DC
  Filled 2018-09-15: qty 118

## 2018-09-15 MED ORDER — ONDANSETRON HCL 4 MG/2ML IJ SOLN: 4.0000 mg | Freq: Four times a day (QID) | INTRAMUSCULAR | Status: DC | PRN

## 2018-09-15 MED ORDER — 0.9 % SODIUM CHLORIDE (POUR BTL) OPTIME
TOPICAL | Status: DC | PRN
  Administered 2018-09-15: 1000 mL

## 2018-09-15 MED ORDER — TRANEXAMIC ACID-NACL 1000-0.7 MG/100ML-% IV SOLN
1000.0000 mg | Freq: Once | INTRAVENOUS | Status: AC
  Administered 2018-09-15: 1000 mg via INTRAVENOUS
  Filled 2018-09-15: qty 100

## 2018-09-15 MED ORDER — FENTANYL CITRATE (PF) 100 MCG/2ML IJ SOLN: 25.0000 ug | INTRAMUSCULAR | Status: DC | PRN

## 2018-09-15 MED ORDER — FENTANYL CITRATE (PF) 100 MCG/2ML IJ SOLN
50.0000 ug | Freq: Once | INTRAMUSCULAR | Status: AC
  Administered 2018-09-15: 50 ug via INTRAVENOUS

## 2018-09-15 MED ORDER — OXYCODONE HCL 5 MG PO TABS
ORAL_TABLET | ORAL | Status: AC
  Administered 2018-09-15: 10 mg via ORAL
  Filled 2018-09-15: qty 2

## 2018-09-15 MED ORDER — DIPHENHYDRAMINE HCL 12.5 MG/5ML PO ELIX: 12.5000 mg | ORAL_SOLUTION | ORAL | Status: DC | PRN

## 2018-09-15 MED ORDER — ASPIRIN EC 325 MG PO TBEC
325.0000 mg | DELAYED_RELEASE_TABLET | Freq: Every day | ORAL | Status: DC
  Administered 2018-09-16 – 2018-09-17 (×2): 325 mg via ORAL
  Filled 2018-09-15 (×2): qty 1

## 2018-09-15 MED ORDER — OXYCODONE HCL 5 MG PO TABS
5.0000 mg | ORAL_TABLET | ORAL | Status: DC | PRN
  Administered 2018-09-15 – 2018-09-17 (×10): 10 mg via ORAL
  Filled 2018-09-15 (×9): qty 2

## 2018-09-15 MED ORDER — MIDAZOLAM HCL 5 MG/5ML IJ SOLN
INTRAMUSCULAR | Status: DC | PRN
  Administered 2018-09-15 (×2): 1 mg via INTRAVENOUS

## 2018-09-15 MED ORDER — DEXAMETHASONE SODIUM PHOSPHATE 10 MG/ML IJ SOLN
INTRAMUSCULAR | Status: AC
  Filled 2018-09-15: qty 2

## 2018-09-15 MED ORDER — GABAPENTIN 300 MG PO CAPS
300.0000 mg | ORAL_CAPSULE | Freq: Every day | ORAL | Status: DC
  Administered 2018-09-15 – 2018-09-16 (×2): 300 mg via ORAL
  Filled 2018-09-15 (×2): qty 1

## 2018-09-15 MED ORDER — INFLUENZA VAC SPLIT HIGH-DOSE 0.5 ML IM SUSY
0.5000 mL | PREFILLED_SYRINGE | INTRAMUSCULAR | Status: AC
  Administered 2018-09-17: 0.5 mL via INTRAMUSCULAR
  Filled 2018-09-15: qty 0.5

## 2018-09-15 MED ORDER — CITALOPRAM HYDROBROMIDE 20 MG PO TABS
20.0000 mg | ORAL_TABLET | Freq: Every day | ORAL | Status: DC
  Administered 2018-09-16 – 2018-09-17 (×2): 20 mg via ORAL
  Filled 2018-09-15 (×2): qty 1

## 2018-09-15 MED ORDER — ONDANSETRON HCL 4 MG/2ML IJ SOLN
INTRAMUSCULAR | Status: AC
  Filled 2018-09-15: qty 2

## 2018-09-15 MED ORDER — ALUM & MAG HYDROXIDE-SIMETH 200-200-20 MG/5ML PO SUSP: 30.0000 mL | ORAL | Status: DC | PRN

## 2018-09-15 MED ORDER — PROPOFOL 500 MG/50ML IV EMUL
INTRAVENOUS | Status: DC | PRN
  Administered 2018-09-15: 100 ug/kg/min via INTRAVENOUS

## 2018-09-15 MED ORDER — POTASSIUM CHLORIDE IN NACL 20-0.9 MEQ/L-% IV SOLN
INTRAVENOUS | Status: DC
  Administered 2018-09-15: 23:00:00 via INTRAVENOUS
  Filled 2018-09-15: qty 1000

## 2018-09-15 MED ORDER — CEFUROXIME SODIUM 750 MG IJ SOLR
INTRAMUSCULAR | Status: AC
  Filled 2018-09-15: qty 1500

## 2018-09-15 MED ORDER — HYDROMORPHONE HCL 1 MG/ML IJ SOLN
0.5000 mg | INTRAMUSCULAR | Status: DC | PRN
  Administered 2018-09-16 (×2): 1 mg via INTRAVENOUS
  Filled 2018-09-15 (×2): qty 1

## 2018-09-15 MED ORDER — PHENOL 1.4 % MT LIQD: 1.0000 | OROMUCOSAL | Status: DC | PRN

## 2018-09-15 MED ORDER — ROCURONIUM BROMIDE 50 MG/5ML IV SOSY
PREFILLED_SYRINGE | INTRAVENOUS | Status: AC
  Filled 2018-09-15: qty 5

## 2018-09-15 MED ORDER — MIDAZOLAM HCL 2 MG/2ML IJ SOLN
INTRAMUSCULAR | Status: AC
  Filled 2018-09-15: qty 2

## 2018-09-15 MED ORDER — CHLORHEXIDINE GLUCONATE 4 % EX LIQD: 60.0000 mL | Freq: Once | CUTANEOUS | Status: DC

## 2018-09-15 MED ORDER — OXYCODONE HCL 5 MG/5ML PO SOLN: 5.0000 mg | Freq: Once | ORAL | Status: DC | PRN

## 2018-09-15 MED ORDER — ATORVASTATIN CALCIUM 20 MG PO TABS
20.0000 mg | ORAL_TABLET | Freq: Every day | ORAL | Status: DC
  Administered 2018-09-16 – 2018-09-17 (×2): 20 mg via ORAL
  Filled 2018-09-15 (×2): qty 1

## 2018-09-15 MED ORDER — ONDANSETRON HCL 4 MG PO TABS: 4.0000 mg | ORAL_TABLET | Freq: Four times a day (QID) | ORAL | Status: DC | PRN

## 2018-09-15 MED ORDER — PROPOFOL 10 MG/ML IV BOLUS
INTRAVENOUS | Status: AC
  Filled 2018-09-15: qty 20

## 2018-09-15 MED ORDER — LORATADINE 10 MG PO TABS
10.0000 mg | ORAL_TABLET | Freq: Every day | ORAL | Status: DC
  Administered 2018-09-16 – 2018-09-17 (×2): 10 mg via ORAL
  Filled 2018-09-15 (×2): qty 1

## 2018-09-15 MED ORDER — FENTANYL CITRATE (PF) 100 MCG/2ML IJ SOLN
INTRAMUSCULAR | Status: DC | PRN
  Administered 2018-09-15 (×4): 25 ug via INTRAVENOUS

## 2018-09-15 MED ORDER — BUPIVACAINE-EPINEPHRINE 0.5% -1:200000 IJ SOLN
INTRAMUSCULAR | Status: DC | PRN
  Administered 2018-09-15: 50 mL

## 2018-09-15 SURGICAL SUPPLY — 85 items
APL SKNCLS STERI-STRIP NONHPOA (GAUZE/BANDAGES/DRESSINGS) ×1
ATTUNE MED DOME PAT 38 KNEE (Knees) ×1 IMPLANT
ATTUNE MED DOME PAT 38MM KNEE (Knees) ×1 IMPLANT
ATTUNE PS FEM LT SZ 8 CEM KNEE (Femur) ×2 IMPLANT
ATTUNE PSRP INSR SZ8 7 KNEE (Insert) ×1 IMPLANT
ATTUNE PSRP INSR SZ8 7MM KNEE (Insert) ×1 IMPLANT
BANDAGE ELASTIC 6 VELCRO ST LF (GAUZE/BANDAGES/DRESSINGS) ×2 IMPLANT
BANDAGE ESMARK 6X9 LF (GAUZE/BANDAGES/DRESSINGS) ×1 IMPLANT
BASE TIBIAL ATTUNE KNEE SZ9 (Knees) IMPLANT
BENZOIN TINCTURE PRP APPL 2/3 (GAUZE/BANDAGES/DRESSINGS) ×3 IMPLANT
BLADE SAGITTAL 25.0X1.19X90 (BLADE) ×2 IMPLANT
BLADE SAGITTAL 25.0X1.19X90MM (BLADE) ×1
BLADE SAW SGTL 13X75X1.27 (BLADE) ×3 IMPLANT
BLADE SURG 10 STRL SS (BLADE) ×6 IMPLANT
BNDG CMPR 9X6 STRL LF SNTH (GAUZE/BANDAGES/DRESSINGS) ×1
BNDG CMPR MED 15X6 ELC VLCR LF (GAUZE/BANDAGES/DRESSINGS) ×1
BNDG ELASTIC 6X15 VLCR STRL LF (GAUZE/BANDAGES/DRESSINGS) ×3 IMPLANT
BNDG ESMARK 6X9 LF (GAUZE/BANDAGES/DRESSINGS) ×3
BOWL SMART MIX CTS (DISPOSABLE) ×3 IMPLANT
BSPLAT TIB 9 CMNT ROT PLAT STR (Knees) ×1 IMPLANT
CEMENT HV SMART SET (Cement) ×6 IMPLANT
CLOSURE STERI-STRIP 1/2X4 (GAUZE/BANDAGES/DRESSINGS) ×1
CLOSURE WOUND 1/2 X4 (GAUZE/BANDAGES/DRESSINGS) ×1
CLSR STERI-STRIP ANTIMIC 1/2X4 (GAUZE/BANDAGES/DRESSINGS) ×1 IMPLANT
COVER SURGICAL LIGHT HANDLE (MISCELLANEOUS) ×3 IMPLANT
COVER WAND RF STERILE (DRAPES) ×3 IMPLANT
CUFF TOURNIQUET SINGLE 34IN LL (TOURNIQUET CUFF) ×3 IMPLANT
CUFF TOURNIQUET SINGLE 44IN (TOURNIQUET CUFF) IMPLANT
DECANTER SPIKE VIAL GLASS SM (MISCELLANEOUS) ×3 IMPLANT
DRAPE EXTREMITY T 121X128X90 (DRAPE) ×3 IMPLANT
DRAPE HALF SHEET 40X57 (DRAPES) ×6 IMPLANT
DRAPE INCISE IOBAN 66X45 STRL (DRAPES) IMPLANT
DRAPE ORTHO SPLIT 77X108 STRL (DRAPES) ×3
DRAPE SURG ORHT 6 SPLT 77X108 (DRAPES) ×1 IMPLANT
DRAPE U-SHAPE 47X51 STRL (DRAPES) ×3 IMPLANT
DRSG AQUACEL AG ADV 3.5X10 (GAUZE/BANDAGES/DRESSINGS) ×3 IMPLANT
DURAPREP 26ML APPLICATOR (WOUND CARE) ×3 IMPLANT
ELECT CAUTERY BLADE 6.4 (BLADE) ×3 IMPLANT
ELECT REM PT RETURN 9FT ADLT (ELECTROSURGICAL) ×3
ELECTRODE REM PT RTRN 9FT ADLT (ELECTROSURGICAL) ×1 IMPLANT
FACESHIELD WRAPAROUND (MASK) ×3 IMPLANT
FACESHIELD WRAPAROUND OR TEAM (MASK) ×1 IMPLANT
GLOVE BIO SURGEON STRL SZ7 (GLOVE) ×3 IMPLANT
GLOVE BIOGEL PI IND STRL 7.0 (GLOVE) ×1 IMPLANT
GLOVE BIOGEL PI IND STRL 7.5 (GLOVE) ×1 IMPLANT
GLOVE BIOGEL PI INDICATOR 7.0 (GLOVE) ×2
GLOVE BIOGEL PI INDICATOR 7.5 (GLOVE) ×2
GLOVE SS BIOGEL STRL SZ 7.5 (GLOVE) ×1 IMPLANT
GLOVE SUPERSENSE BIOGEL SZ 7.5 (GLOVE) ×2
GOWN STRL REUS W/ TWL LRG LVL3 (GOWN DISPOSABLE) ×1 IMPLANT
GOWN STRL REUS W/ TWL XL LVL3 (GOWN DISPOSABLE) ×1 IMPLANT
GOWN STRL REUS W/TWL LRG LVL3 (GOWN DISPOSABLE) ×3
GOWN STRL REUS W/TWL XL LVL3 (GOWN DISPOSABLE) ×3
HANDPIECE INTERPULSE COAX TIP (DISPOSABLE) ×3
HOOD PEEL AWAY FACE SHEILD DIS (HOOD) ×6 IMPLANT
IMMOBILIZER KNEE 22 (SOFTGOODS) ×2 IMPLANT
IMMOBILIZER KNEE 22 UNIV (SOFTGOODS) ×3 IMPLANT
KIT BASIN OR (CUSTOM PROCEDURE TRAY) ×3 IMPLANT
KIT TURNOVER KIT B (KITS) ×3 IMPLANT
MANIFOLD NEPTUNE II (INSTRUMENTS) ×3 IMPLANT
MARKER SKIN DUAL TIP RULER LAB (MISCELLANEOUS) ×3 IMPLANT
NEEDLE HYPO 22GX1.5 SAFETY (NEEDLE) ×6 IMPLANT
NS IRRIG 1000ML POUR BTL (IV SOLUTION) ×3 IMPLANT
PACK TOTAL JOINT (CUSTOM PROCEDURE TRAY) ×3 IMPLANT
PAD ARMBOARD 7.5X6 YLW CONV (MISCELLANEOUS) ×6 IMPLANT
PIN STEINMAN FIXATION KNEE (PIN) ×2 IMPLANT
PIN THREADED HEADED SIGMA (PIN) ×2 IMPLANT
SET HNDPC FAN SPRY TIP SCT (DISPOSABLE) ×1 IMPLANT
STRIP CLOSURE SKIN 1/2X4 (GAUZE/BANDAGES/DRESSINGS) ×2 IMPLANT
SUCTION FRAZIER HANDLE 10FR (MISCELLANEOUS) ×2
SUCTION TUBE FRAZIER 10FR DISP (MISCELLANEOUS) ×1 IMPLANT
SUT MNCRL AB 3-0 PS2 18 (SUTURE) ×3 IMPLANT
SUT VIC AB 0 CT1 27 (SUTURE) ×6
SUT VIC AB 0 CT1 27XBRD ANBCTR (SUTURE) ×2 IMPLANT
SUT VIC AB 1 CT1 27 (SUTURE) ×3
SUT VIC AB 1 CT1 27XBRD ANBCTR (SUTURE) ×1 IMPLANT
SUT VIC AB 2-0 CT1 27 (SUTURE) ×6
SUT VIC AB 2-0 CT1 TAPERPNT 27 (SUTURE) ×2 IMPLANT
SYR CONTROL 10ML LL (SYRINGE) ×6 IMPLANT
TIBIAL BASE ATTUNE KNEE SZ9 (Knees) ×3 IMPLANT
TOWEL OR 17X24 6PK STRL BLUE (TOWEL DISPOSABLE) ×3 IMPLANT
TOWEL OR 17X26 10 PK STRL BLUE (TOWEL DISPOSABLE) ×3 IMPLANT
TRAY CATH 16FR W/PLASTIC CATH (SET/KITS/TRAYS/PACK) IMPLANT
TRAY FOLEY CATH SILVER 16FR (SET/KITS/TRAYS/PACK) ×3 IMPLANT
WATER STERILE IRR 1000ML POUR (IV SOLUTION) ×3 IMPLANT

## 2018-09-15 NOTE — Transfer of Care (Signed)
Immediate Anesthesia Transfer of Care Note  Patient: Fernando Clark  Procedure(s) Performed: TOTAL KNEE ARTHROPLASTY (Left Knee)  Patient Location: PACU  Anesthesia Type:MAC and Spinal  Level of Consciousness: awake and alert   Airway & Oxygen Therapy: Patient Spontanous Breathing and Patient connected to nasal cannula oxygen  Post-op Assessment: Report given to RN and Post -op Vital signs reviewed and stable  Post vital signs: Reviewed and stable  Last Vitals:  Vitals Value Taken Time  BP    Temp    Pulse 51 09/15/2018 11:42 AM  Resp 14 09/15/2018 11:42 AM  SpO2 93 % 09/15/2018 11:42 AM  Vitals shown include unvalidated device data.  Last Pain:  Vitals:   09/15/18 0728  PainSc: 3       Patients Stated Pain Goal: 4 (16/60/63 0160)  Complications: No apparent anesthesia complications

## 2018-09-15 NOTE — Interval H&P Note (Signed)
History and Physical Interval Note:  09/15/2018 7:13 AM  Fernando Clark  has presented today for surgery, with the diagnosis of djd left knee  The various methods of treatment have been discussed with the patient and family. After consideration of risks, benefits and other options for treatment, the patient has consented to  Procedure(s): TOTAL KNEE ARTHROPLASTY (Left) as a surgical intervention .  The patient's history has been reviewed, patient examined, no change in status, stable for surgery.  I have reviewed the patient's chart and labs.  Questions were answered to the patient's satisfaction.     Fernando Clark

## 2018-09-15 NOTE — Progress Notes (Signed)
Orthopedic Tech Progress Note Patient Details:  Sergi Gellner 11/24/1945 [id number]  CPM Left Knee CPM Left Knee: On Left Knee Flexion (Degrees): 90 Left Knee Extension (Degrees): 0 Additional Comments: trapeze bar patient helper  Post Interventions Patient Tolerated: Well Instructions Provided: Care of device Viewed order from doctor's order list Hildred Priest 09/15/2018, 12:42 PM

## 2018-09-15 NOTE — Anesthesia Procedure Notes (Signed)
Spinal  Patient location during procedure: OR Start time: 09/15/2018 9:33 AM End time: 09/15/2018 9:36 AM Staffing Anesthesiologist: Brennan Bailey, MD Performed: anesthesiologist  Preanesthetic Checklist Completed: patient identified, surgical consent, pre-op evaluation, timeout performed, IV checked, risks and benefits discussed and monitors and equipment checked Spinal Block Patient position: sitting Prep: site prepped and draped and DuraPrep Patient monitoring: cardiac monitor, continuous pulse ox and blood pressure Approach: midline Location: L4-5 Injection technique: single-shot Needle Needle type: Pencan  Needle gauge: 24 G Needle length: 9 cm Additional Notes Risks, benefits, and alternative discussed. Patient gave consent to procedure. Prepped and draped in sitting position. Clear CSF obtained after one needle pass. Positive terminal aspiration. No pain or paraesthesias with injection. Patient tolerated procedure well. Vital signs stable. Tawny Asal, MD

## 2018-09-15 NOTE — Anesthesia Postprocedure Evaluation (Signed)
Anesthesia Post Note  Patient: Fernando Clark  Procedure(s) Performed: TOTAL KNEE ARTHROPLASTY (Left Knee)     Patient location during evaluation: PACU Anesthesia Type: Spinal Level of consciousness: awake and alert Pain management: pain level controlled Vital Signs Assessment: post-procedure vital signs reviewed and stable Respiratory status: spontaneous breathing, nonlabored ventilation and respiratory function stable Cardiovascular status: blood pressure returned to baseline and stable Postop Assessment: no apparent nausea or vomiting, spinal receding, no headache and no backache Anesthetic complications: no    Last Vitals:  Vitals:   09/15/18 1145 09/15/18 1155  BP: 108/75 101/65  Pulse: (!) 51 (!) 47  Resp: 16 11  Temp: (!) 36.1 C   SpO2: 97% 92%    Last Pain:  Vitals:   09/15/18 1145  PainSc: 0-No pain                 Brennan Bailey

## 2018-09-15 NOTE — Care Plan (Signed)
Ortho Bundle Case Management Note  Patient Details  Name: Fernando Clark MRN: [id number] Date of Birth: April 21, 1946  Met with patient in the office. Will discharge to home with family and HHPT. Equipment ordered.                           DME Arranged:  Gilford Rile rolling, CPM DME Agency:  [employer]  HH Arranged:  PT [place] Agency:  [facility]  Additional Comments: Please contact me with any questions of if this plan should need to change.  Ladell Heads,  [place]  [phone] 09/15/2018, 9:05 AM

## 2018-09-15 NOTE — Anesthesia Procedure Notes (Signed)
Anesthesia Regional Block: Adductor canal block   Pre-Anesthetic Checklist: ,, timeout performed, Correct Patient, Correct Site, Correct Laterality, Correct Procedure, Correct Position, site marked, Risks and benefits discussed, pre-op evaluation,  At surgeon's request and post-op pain management  Laterality: Left  Prep: Maximum Sterile Barrier Precautions used, chloraprep       Needles:  Injection technique: Single-shot  Needle Type: Echogenic Stimulator Needle     Needle Length: 9cm  Needle Gauge: 22     Additional Needles:   Procedures:,,,, ultrasound used (permanent image in chart),,,,  Narrative:  Start time: 09/15/2018 7:52 AM End time: 09/15/2018 7:55 AM Injection made incrementally with aspirations every 5 mL.  Performed by: Personally  Anesthesiologist: Brennan Bailey, MD  Additional Notes: Risks, benefits, and alternative discussed. Patient gave consent for procedure. Patient prepped and draped in sterile fashion. Sedation administered, patient remains easily responsive to voice. Relevant anatomy identified with ultrasound guidance. Local anesthetic given in 5cc increments with no signs or symptoms of intravascular injection. No pain or paraesthesias with injection. Patient monitored throughout procedure with signs of LAST or immediate complications. Tolerated well. Ultrasound image placed in chart.  Tawny Asal, MD

## 2018-09-15 NOTE — Op Note (Signed)
MRN:     [id number] DOB/AGE:    [birth year] / 72 y.o.       OPERATIVE REPORT   DATE OF PROCEDURE:  09/15/2018      PREOPERATIVE DIAGNOSIS:   Primary Localized Osteoarthritis left Knee       Estimated body mass index is 34.48 kg/m as calculated from the following:   Height as of this encounter: 6' 0.99" (1.854 m).   Weight as of this encounter: 118.5 kg.                                                       POSTOPERATIVE DIAGNOSIS:   Same                                                                 PROCEDURE:  Procedure(s): TOTAL KNEE ARTHROPLASTY Using Depuy Attune RP implants #8 Femur, #9Tibia, 34mm  RP bearing, 38 Patella    SURGEON: Hazle Ogburn A. Noemi Chapel, MD   ASSISTANT: Matthew Saras, PA-C, present and scrubbed throughout the case, critical for retraction, instrumentation, and closure.  ANESTHESIA: Spinal with Adductor Nerve Block  TOURNIQUET TIME: 55 minutes   COMPLICATIONS:  None       SPECIMENS: None   INDICATIONS FOR PROCEDURE: The patient has djd of the knee with varus deformities, XR shows bone on bone arthritis. Patient has failed all conservative measures including anti-inflammatory medicines, narcotics, attempts at exercise and weight loss, cortisone injections and viscosupplementation.  Risks and benefits of surgery have been discussed, questions answered.    DESCRIPTION OF PROCEDURE: The patient identified by armband, received right adductor canal block and IV antibiotics, in the holding area at [facility]. Patient taken to the operating room, appropriate anesthetic monitors were attached. Spinal anesthesia induced with the patient in supine position, Foley catheter was inserted. Tourniquet applied high to the operative thigh. Lateral post and foot positioner applied to the table, the lower extremity was then prepped and draped in usual sterile fashion from the ankle to the tourniquet. Time-out procedure was performed. The limb was wrapped with an Esmarch  bandage and the tourniquet inflated to 365 mmHg.   We began the operation by making a 6cm anterior midline incision. Small bleeders in the skin and the subcutaneous tissue identified and cauterized. Transverse retinaculum was incised and reflected medially and a medial parapatellar arthrotomy was accomplished. the patella was everted and theprepatellar fat pad resected. The superficial medial collateral ligament was then elevated from anterior to posterior along the proximal flare of the tibia and anterior half of the menisci resected. The knee was hyperflexed exposing bone on bone arthritis. Peripheral and notch osteophytes as well as the cruciate ligaments were then resected. We continued to work our way around posteriorly along the proximal tibia, and externally rotated the tibia subluxing it out from underneath the femur. A McHale retractor was placed through the notch and a lateral Hohmann retractor placed, and an external tibial guide was placed.  The tibial cutting guide was pinned into place allowing resection of 4 mm of bone medially and about 6 mm of bone laterally because of her  varus deformity.   Satisfied with the tibial resection, we then entered the distal femur 2 mm anterior to the PCL origin with the intramedullary guide rod and applied the distal femoral cutting guide set at 69mm, with 5 degrees of valgus. This was pinned along the epicondylar axis. At this point, the distal femoral cut was accomplished without difficulty. We then sized for a 8 femoral component and pinned the guide in 3 degrees of external rotation.The chamfer cutting guide was pinned into place. The anterior, posterior, and chamfer cuts were accomplished without difficulty followed by the  RP box cutting guide and the box cut. We also removed posterior osteophytes from the posterior femoral condyles. At this time, the knee was brought into full extension. We checked our extension and flexion gaps and found them symmetric at  7.  The patella thickness measured at 82m m. We set the cutting guide at 15 and removed the posterior patella sized for 38 button and drilled the lollipop. The knee was then once again hyperflexed exposing the proximal tibia. We sized for a # 9 tibial base plate, applied the smokestack and the conical reamer followed by the the Delta fin keel punch. We then hammered into place the  RP trial femoral component, inserted a trial bearing, trial patellar button, and took the knee through range of motion from 0-130 degrees. No thumb pressure was required for patellar tracking.   At this point, all trial components were removed, a double batch of DePuy HV cement  was mixed and applied to all bony metallic mating surfaces. In order, we hammered into place the tibial tray and removed excess cement, the femoral component and removed excess cement, a 7 mm  RP bearing was inserted, and the knee brought to full extension with compression. The patellar button was clamped into place, and excess cement removed. While the cement cured the wound was irrigated out with normal saline solution pulse lavage, and exparel was injected throughout the knee. Ligament stability and patellar tracking were checked and found to be excellent..   The parapatellar arthrotomy was closed with  #1 Vicryl suture. The subcutaneous tissue with 0 and 2-0 undyed Vicryl suture, and 4-0 Monocryl.. A dressing of Aquaseal, 4 x 4, dressing sponges, Webril, and Ace wrap applied. Needle and sponge count were correct times 2.The patient awakened, extubated, and taken to recovery room without difficulty. Vascular status was normal, pulses 2+ and symmetric.    Lorn Junes 02/03/2018, 8:56 AM

## 2018-09-16 DIAGNOSIS — G4730 Sleep apnea, unspecified: Secondary | ICD-10-CM | POA: Diagnosis present

## 2018-09-16 DIAGNOSIS — Z23 Encounter for immunization: Secondary | ICD-10-CM | POA: Diagnosis not present

## 2018-09-16 DIAGNOSIS — Z79899 Other long term (current) drug therapy: Secondary | ICD-10-CM | POA: Diagnosis not present

## 2018-09-16 DIAGNOSIS — K219 Gastro-esophageal reflux disease without esophagitis: Secondary | ICD-10-CM | POA: Diagnosis present

## 2018-09-16 DIAGNOSIS — M1712 Unilateral primary osteoarthritis, left knee: Secondary | ICD-10-CM | POA: Diagnosis present

## 2018-09-16 DIAGNOSIS — R5383 Other fatigue: Secondary | ICD-10-CM | POA: Diagnosis present

## 2018-09-16 DIAGNOSIS — E785 Hyperlipidemia, unspecified: Secondary | ICD-10-CM | POA: Diagnosis present

## 2018-09-16 DIAGNOSIS — Z87891 Personal history of nicotine dependence: Secondary | ICD-10-CM | POA: Diagnosis not present

## 2018-09-16 DIAGNOSIS — R42 Dizziness and giddiness: Secondary | ICD-10-CM | POA: Diagnosis not present

## 2018-09-16 DIAGNOSIS — G479 Sleep disorder, unspecified: Secondary | ICD-10-CM | POA: Diagnosis present

## 2018-09-16 DIAGNOSIS — R110 Nausea: Secondary | ICD-10-CM | POA: Diagnosis not present

## 2018-09-16 DIAGNOSIS — F329 Major depressive disorder, single episode, unspecified: Secondary | ICD-10-CM | POA: Diagnosis present

## 2018-09-16 LAB — BASIC METABOLIC PANEL
Anion gap: 3 — ABNORMAL LOW (ref 5–15)
BUN: 15 mg/dL (ref 8–23)
CO2: 25 mmol/L (ref 22–32)
CREATININE: 0.93 mg/dL (ref 0.61–1.24)
Calcium: 8.8 mg/dL — ABNORMAL LOW (ref 8.9–10.3)
Chloride: 108 mmol/L (ref 98–111)
GFR calc non Af Amer: >60 mL/min (ref >60)
Glucose, Bld: 185 mg/dL — ABNORMAL HIGH (ref 70–99)
POTASSIUM: 4 mmol/L (ref 3.5–5.1)
Sodium: 136 mmol/L (ref 135–145)

## 2018-09-16 LAB — CBC
HEMATOCRIT: 35.7 % — AB (ref 39.0–52.0)
HEMOGLOBIN: 11.8 g/dL — AB (ref 13.0–17.0)
MCH: 30.6 pg (ref 26.0–34.0)
MCHC: 33.1 g/dL (ref 30.0–36.0)
MCV: 92.7 fL (ref 80.0–100.0)
Platelets: 174 10*3/uL (ref 150–400)
RBC: 3.85 MIL/uL — ABNORMAL LOW (ref 4.22–5.81)
RDW: 12.3 % (ref 11.5–15.5)
WBC: 15 10*3/uL — AB (ref 4.0–10.5)
nRBC: 0 % (ref 0.0–0.2)

## 2018-09-16 LAB — TROPONIN I: Troponin I: <0.03 ng/mL (ref <0.03)

## 2018-09-16 MED ORDER — SODIUM CHLORIDE 0.9 % IV BOLUS: 500.0000 mL | Freq: Once | INTRAVENOUS | Status: DC

## 2018-09-16 NOTE — Evaluation (Signed)
Physical Therapy Evaluation Patient Details Name: Fernando Clark MRN: [id number] DOB: 12/13/1945 Today's Date: 09/16/2018   History of Present Illness  Admitted for LTKA, Fernando Clark;  has a past medical history of Arthritis, Bursitis, Depression, GERD (gastroesophageal reflux disease), Headache, HLD (hyperlipidemia), vasectomy, Osteoarthritis, Primary localized osteoarthritis of left knee, Sleep apnea, and Snoring (10/14/2014).  Clinical Impression   Pt is s/p TKA resulting in the deficits listed below (see PT Problem List). Independent prior to admission; Presents with decr functional mobility, and decr activity tolerance; Noted status post walk; will watch closely in pm session;  Pt will benefit from skilled PT to increase their independence and safety with mobility to allow discharge to the venue listed below.      Follow Up Recommendations Follow surgeon's recommendation for DC plan and follow-up therapies;Supervision/Assistance - 24 hour    Equipment Recommendations  Rolling walker with 5" wheels;3in1 (PT)    Recommendations for Other Services       Precautions / Restrictions Precautions Precautions: Knee;Fall Precaution Comments: watch for decr activity tolerance Restrictions Weight Bearing Restrictions: Yes LLE Weight Bearing: Weight bearing as tolerated      Mobility  Bed Mobility Overal bed mobility: Needs Assistance Bed Mobility: Supine to Sit     Supine to sit: Min guard     General bed mobility comments: cues for tedhnique  Transfers Overall transfer level: Needs assistance Equipment used: Rolling walker (2 wheeled) Transfers: Sit to/from Stand Sit to Stand: Min assist         General transfer comment: cues for hand placement; Min assist to steady with transition of hands to RW  Ambulation/Gait Ambulation/Gait assistance: Min guard Gait Distance (Feet): 300 Feet Assistive device: Rolling walker (2 wheeled) Gait Pattern/deviations: Step-through  pattern(emerging)     General Gait Details: Nice, stable knee in stance; Cues to self-monitor for activity tolernace; became lightheaded and requested to sit; then we rolled all the way back to room  Stairs            Wheelchair Mobility    Modified Rankin (Stroke Patients Only)       Balance                                             Pertinent Vitals/Pain Pain Assessment: 0-10 Pain Score: 9  Pain Location: L knee with weight bearing Pain Descriptors / Indicators: Aching Pain Intervention(s): Monitored during session    Home Living Family/patient expects to be discharged to:: Private residence Living Arrangements: Alone Available Help at Discharge: Friend(s);Available 24 hours/day Type of Home: House Home Access: Stairs to enter     Home Layout: Two level;Bed/bath upstairs        Prior Function Level of Independence: Independent               Hand Dominance        Extremity/Trunk Assessment   Upper Extremity Assessment Upper Extremity Assessment: Overall WFL for tasks assessed    Lower Extremity Assessment Lower Extremity Assessment: LLE deficits/detail LLE Deficits / Details: Grossly decr AROM and strength limited by pain postop, still, with good ROM, approx 2-88 deg       Communication   Communication: No difficulties  Cognition Arousal/Alertness: Awake/alert Behavior During Therapy: WFL for tasks assessed/performed Overall Cognitive Status: Within Functional Limits for tasks assessed  General Comments      Exercises Total Joint Exercises Quad Sets: AROM;Left;10 reps Straight Leg Raises: AROM;Left;10 reps   Assessment/Plan    PT Assessment Patient needs continued PT services  PT Problem List Decreased strength;Decreased range of motion;Decreased activity tolerance;Decreased balance;Decreased mobility;Decreased knowledge of use of DME;Decreased safety  awareness;Decreased knowledge of precautions;Pain       PT Treatment Interventions DME instruction;Gait training;Stair training;Functional mobility training;Therapeutic activities;Therapeutic exercise;Balance training;Patient/family education    PT Goals (Current goals can be found in the Care Plan section)  Acute Rehab PT Goals Patient Stated Goal: back to golf PT Goal Formulation: With patient Time For Goal Achievement: 09/23/18 Potential to Achieve Goals: Good    Frequency 7X/week   Barriers to discharge   Flight of steps to access full bathroom    Co-evaluation               AM-PAC PT "6 Clicks" Daily Activity  Outcome Measure Difficulty turning over in bed (including adjusting bedclothes, sheets and blankets)?: A Little Difficulty moving from lying on back to sitting on the side of the bed? : A Little Difficulty sitting down on and standing up from a chair with arms (e.g., wheelchair, bedside commode, etc,.)?: A Little Help needed moving to and from a bed to chair (including a wheelchair)?: A Little Help needed walking in hospital room?: A Little Help needed climbing 3-5 steps with a railing? : A Little 6 Click Score: 18    End of Session Equipment Utilized During Treatment: Gait belt Activity Tolerance: Patient tolerated treatment well;Other (comment)(still some lightheadedness with amb) Patient left: in chair;with call bell/phone within reach;Other (comment)(Orthos PA in the see pt) Nurse Communication: Mobility status PT Visit Diagnosis: Unsteadiness on feet (R26.81);Other abnormalities of gait and mobility (R26.89);Pain Pain - Right/Left: Left Pain - part of body: Knee    Time: 1601-0932 PT Time Calculation (min) (ACUTE ONLY): 33 min   Charges:   PT Evaluation $PT Eval Moderate Complexity: 1 Mod PT Treatments $Gait Training: 8-22 mins        Roney Marion, PT  Acute Rehabilitation Services Pager [phone] Office [phone]   Colletta [place] 09/16/2018, 12:43 PM

## 2018-09-16 NOTE — Progress Notes (Signed)
Subjective: 1 Day Post-Op Procedure(s) (LRB): TOTAL KNEE ARTHROPLASTY (Left) Patient reports pain as 7 on 0-10 scale.    Objective: Vital signs in last 24 hours: Temp:  [97 F (36.1 C)-98.1 F (36.7 C)] 97.3 F (36.3 C) (11/05 0434) Pulse Rate:  [45-66] 64 (11/05 0434) Resp:  [6-20] 20 (11/05 0434) BP: (101-153)/(64-94) 129/76 (11/05 0434) SpO2:  [91 %-98 %] 94 % (11/05 0434)  Intake/Output from previous day: 11/04 0701 - 11/05 0700 In: 1760 [P.O.:360; I.V.:1400] Out: 2700 [Urine:2650; Blood:50] Intake/Output this shift: No intake/output data recorded.  Recent Labs    09/16/18 0150  HGB 11.8*   Recent Labs    09/16/18 0150  WBC 15.0*  RBC 3.85*  HCT 35.7*  PLT 174   Recent Labs    09/16/18 0150  NA 136  K 4.0  CL 108  CO2 25  BUN 15  CREATININE 0.93  GLUCOSE 185*  CALCIUM 8.8*   No results for input(s): LABPT, INR in the last 72 hours.  ABD soft Neurovascular intact Sensation intact distally patient is light headed, dizzy, diaphoretic and nauseated  Anticipated LOS equal to or greater than 2 midnights due to - Age 70 and older with one or more of the following:  - Obesity  - Expected need for hospital services (PT, OT, Nursing) required for safe  discharge  - Anticipated need for postoperative skilled nursing care or inpatient rehab  - Active co-morbidities: None OR   - Unanticipated findings during/Post Surgery: Slow post-op progression: GI, pain control, mobility  - Patient is a high risk of re-admission due to: None   Assessment/Plan: 1 Day Post-Op Procedure(s) (LRB): TOTAL KNEE ARTHROPLASTY (Left)  Principal Problem:   Primary localized osteoarthritis of left knee Active Problems:   Fatigue due to sleep pattern disturbance   Depression Patient had trouble with light headed and dizziness during the walk.  He had to sit in a chair and be wheeled back to the room.  Once in the room he became diaphoretic and nauseated.  EKG, Trop, and fluid  bolus ordered. IF stable OK for PT this after and will plan on discharge tomorrow.  Patient changed to inpatient status due to medical complications post op.    Advance diet Up with therapy Plan for discharge tomorrow    Linda Hedges 09/16/2018, 10:07 AM

## 2018-09-16 NOTE — Progress Notes (Signed)
Patient refused CPAP tonight. Patient states he doe not wear one at thome.

## 2018-09-16 NOTE — Progress Notes (Signed)
Physical Therapy Treatment Patient Details Name: Fernando Clark MRN: [id number] DOB: December 14, 1945 Today's Date: 09/16/2018    History of Present Illness Admitted for LTKA, [name];  has a past medical history of Arthritis, Bursitis, Depression, GERD (gastroesophageal reflux disease), Headache, HLD (hyperlipidemia), vasectomy, Osteoarthritis, Primary localized osteoarthritis of left knee, Sleep apnea, and Snoring (10/14/2014).    PT Comments    Continuing work on functional mobility and activity tolerance;  Improved activity tolerance noted; Reinforced knee precautions; on track fo rdc home tomorrow   Follow Up Recommendations  Follow surgeon's recommendation for DC plan and follow-up therapies;Supervision/Assistance - 24 hour     Equipment Recommendations  Rolling walker with 5" wheels;3in1 (PT)    Recommendations for Other Services       Precautions / Restrictions Precautions Precautions: Knee;Fall Precaution Booklet Issued: Yes (comment) Precaution Comments: watch for decr activity tolerance Restrictions LLE Weight Bearing: Weight bearing as tolerated  Pt educated to not allow any pillow or bolster under knee for healing with optimal range of motion.    Mobility  Bed Mobility Overal bed mobility: Needs Assistance Bed Mobility: Supine to Sit     Supine to sit: Min guard     General bed mobility comments: cues for tedhnique  Transfers Overall transfer level: Needs assistance Equipment used: Rolling walker (2 wheeled) Transfers: Sit to/from Stand Sit to Stand: Min assist         General transfer comment: cues for hand placement; Min assist to steady with transition of hands to RW  Ambulation/Gait Ambulation/Gait assistance: Min guard Gait Distance (Feet): 120 Feet Assistive device: Rolling walker (2 wheeled) Gait Pattern/deviations: Step-through pattern(emerging)     General Gait Details: Nice, stable knee in stance; Cues to self-monitor for activity  tolernace   Stairs             Wheelchair Mobility    Modified Rankin (Stroke Patients Only)       Balance                                            Cognition Arousal/Alertness: Awake/alert Behavior During Therapy: WFL for tasks assessed/performed Overall Cognitive Status: Within Functional Limits for tasks assessed                                        Exercises      General Comments        Pertinent Vitals/Pain Pain Assessment: 0-10 Pain Score: 8  Pain Location: L knee with weight bearing Pain Descriptors / Indicators: Aching Pain Intervention(s): Ice applied    Home Living                      Prior Function            PT Goals (current goals can now be found in the care plan section) Acute Rehab PT Goals Patient Stated Goal: back to golf PT Goal Formulation: With patient Time For Goal Achievement: 09/23/18 Potential to Achieve Goals: Good Progress towards PT goals: Progressing toward goals    Frequency    7X/week      PT Plan Current plan remains appropriate    Co-evaluation              AM-PAC PT "6 Clicks" Daily Activity  Outcome  Measure  Difficulty turning over in bed (including adjusting bedclothes, sheets and blankets)?: A Little Difficulty moving from lying on back to sitting on the side of the bed? : A Little Difficulty sitting down on and standing up from a chair with arms (e.g., wheelchair, bedside commode, etc,.)?: A Little Help needed moving to and from a bed to chair (including a wheelchair)?: A Little Help needed walking in hospital room?: A Little Help needed climbing 3-5 steps with a railing? : A Little 6 Click Score: 18    End of Session Equipment Utilized During Treatment: Gait belt Activity Tolerance: Patient tolerated treatment well Patient left: in chair;with call bell/phone within reach;Other (comment) Nurse Communication: Mobility status PT Visit Diagnosis:  Unsteadiness on feet (R26.81);Other abnormalities of gait and mobility (R26.89);Pain Pain - Right/Left: Left Pain - part of body: Knee     Time: 4327-6147 PT Time Calculation (min) (ACUTE ONLY): 23 min  Charges:  $Gait Training: 8-22 mins $Therapeutic Activity: 8-22 mins                     Roney Marion, PT  Acute Rehabilitation Services Pager [phone] Office [place] 09/16/2018, 5:12 PM

## 2018-09-16 NOTE — Care Management Note (Signed)
Case Management Note  Patient Details  Name: Fernando Clark MRN: [id number] Date of Birth: 01/08/1946  Subjective/Objective:  L TKA                  Action/Plan: NCM spoke to pt and offered choice for [facility]. Pt agreeable to [facility] for [facility] (preoperatively arranged with [facility] from surgeon's office). Contacted [facility] to follow up on referral. Pt has CPM and RW from [place] at home. Friend will be in the home to assist.   Expected Discharge Date:                Expected Discharge Plan:  [place]  In-House Referral:  NA  Discharge planning Services  CM Consult  Post Acute Care Choice:  Home Health Choice offered to:  Patient  DME Arranged:  Walker rolling, CPM DME Agency:  [employer]  HH Arranged:  PT [place] Agency:  [facility] (formerly [employer])  Status of Service:  Completed, signed off  If discussed at [employer], dates discussed:    Additional Comments:  Erenest Rasher, RN 09/16/2018, 9:53 AM

## 2018-09-17 LAB — CBC
HCT: 32.2 % — ABNORMAL LOW (ref 39.0–52.0)
HEMOGLOBIN: 10.6 g/dL — AB (ref 13.0–17.0)
MCH: 30.9 pg (ref 26.0–34.0)
MCHC: 32.9 g/dL (ref 30.0–36.0)
MCV: 93.9 fL (ref 80.0–100.0)
Platelets: 180 10*3/uL (ref 150–400)
RBC: 3.43 MIL/uL — ABNORMAL LOW (ref 4.22–5.81)
RDW: 12.5 % (ref 11.5–15.5)
WBC: 18.2 10*3/uL — ABNORMAL HIGH (ref 4.0–10.5)
nRBC: 0 % (ref 0.0–0.2)

## 2018-09-17 LAB — BASIC METABOLIC PANEL
Anion gap: 5 (ref 5–15)
BUN: 14 mg/dL (ref 8–23)
CHLORIDE: 103 mmol/L (ref 98–111)
CO2: 29 mmol/L (ref 22–32)
CREATININE: 0.75 mg/dL (ref 0.61–1.24)
Calcium: 8.8 mg/dL — ABNORMAL LOW (ref 8.9–10.3)
GFR calc Af Amer: >60 mL/min (ref >60)
GFR calc non Af Amer: >60 mL/min (ref >60)
GLUCOSE: 181 mg/dL — AB (ref 70–99)
Potassium: 4.5 mmol/L (ref 3.5–5.1)
SODIUM: 137 mmol/L (ref 135–145)

## 2018-09-17 MED ORDER — OXYCODONE HCL 5 MG PO TABS: ORAL_TABLET | ORAL | 0 refills | Status: DC

## 2018-09-17 MED ORDER — POLYETHYLENE GLYCOL 3350 17 G PO PACK: PACK | ORAL | 0 refills | Status: DC

## 2018-09-17 MED ORDER — ASPIRIN 325 MG PO TBEC: DELAYED_RELEASE_TABLET | ORAL | 0 refills | Status: DC

## 2018-09-17 MED ORDER — GABAPENTIN 300 MG PO CAPS: 300.0000 mg | ORAL_CAPSULE | Freq: Every day | ORAL | 1 refills | Status: DC

## 2018-09-17 MED ORDER — DOCUSATE SODIUM 100 MG PO CAPS: ORAL_CAPSULE | ORAL | 0 refills | Status: DC

## 2018-09-17 NOTE — Plan of Care (Signed)
  Problem: Education: Goal: Knowledge of General Education information will improve Description: Including pain rating scale, medication(s)/side effects and non-pharmacologic comfort measures Outcome: Progressing   Problem: Pain Managment: Goal: General experience of comfort will improve Outcome: Progressing   Problem: Safety: Goal: Ability to remain free from injury will improve Outcome: Progressing   

## 2018-09-17 NOTE — Progress Notes (Signed)
Provided discharge education/instructions, all questions and concerns addressed, Pt not in distress, waiting on 2nd PT session. Pt to discharge home with belongings accompanied by wife.

## 2018-09-17 NOTE — Progress Notes (Signed)
Pt discharged home with belongings accompanied by wife.

## 2018-09-17 NOTE — Discharge Summary (Signed)
Patient ID: Fernando Clark MRN: [id number] DOB/AGE: 04/13/1946 72 y.o.  Admit date: 09/15/2018 Discharge date: 09/17/2018  Admission Diagnoses:  Principal Problem:   Primary localized osteoarthritis of left knee Active Problems:   Fatigue due to sleep pattern disturbance   Depression   Discharge Diagnoses:  Same  Past Medical History:  Diagnosis Date  . Arthritis    "hands" (09/15/2018)  . Bursitis    R heel  . Depression    tempermental mood  . GERD (gastroesophageal reflux disease)   . Headache   . HLD (hyperlipidemia)   . Hx of vasectomy   . Osteoarthritis    L knee  . Primary localized osteoarthritis of left knee   . Sleep apnea    "I'm getting mask" (09/15/2018)  . Snoring 10/14/2014    Surgeries: Procedure(s): TOTAL KNEE ARTHROPLASTY on 09/15/2018   Consultants:   Discharged Condition: Improved  Hospital Course: Fernando Clark is an 72 y.o. male who was admitted 09/15/2018 for operative treatment ofPrimary localized osteoarthritis of left knee. Patient has severe unremitting pain that affects sleep, daily activities, and work/hobbies. After pre-op clearance the patient was taken to the operating room on 09/15/2018 and underwent  Procedure(s): TOTAL KNEE ARTHROPLASTY.    Patient was given perioperative antibiotics:  Anti-infectives (From admission, onward)   Start     Dose/Rate Route Frequency Ordered Stop   09/15/18 2000  ceFAZolin (ANCEF) IVPB 2g/100 mL premix     2 g 200 mL/hr over 30 Minutes Intravenous Every 6 hours 09/15/18 1808 09/16/18 0527   09/15/18 1056  cefUROXime (ZINACEF) injection  Status:  Discontinued       As needed 09/15/18 1056 09/15/18 1136   09/15/18 0900  ceFAZolin (ANCEF) 3 g in dextrose 5 % 50 mL IVPB     3 g 100 mL/hr over 30 Minutes Intravenous To [facility] 09/12/18 1246 09/15/18 0945       Patient was given sequential compression devices, early ambulation, and chemoprophylaxis to prevent DVT.  Post op day one patient  was light headed and nauseated.  EKG was normal   He had fluid bolus times two and 3 sessions of physical therapy     Much better post op day 2.    Discharged post op day 2  Patient benefited maximally from hospital stay and there were no complications.    Recent vital signs:  Patient Vitals for the past 24 hrs:  BP Temp Temp src Pulse Resp SpO2  09/17/18 0820 (!) 155/91 - - 75 - 97 %  09/17/18 0817 (!) 172/95 98 F (36.7 C) Oral 71 16 95 %  09/17/18 0419 (!) 141/90 98.1 F (36.7 C) Oral 78 16 100 %  09/16/18 2138 140/88 97.6 F (36.4 C) Oral 75 16 95 %     Recent laboratory studies:  Recent Labs    09/16/18 0150 09/17/18 0312  WBC 15.0* 18.2*  HGB 11.8* 10.6*  HCT 35.7* 32.2*  PLT 174 180  NA 136 137  K 4.0 4.5  CL 108 103  CO2 25 29  BUN 15 14  CREATININE 0.93 0.75  GLUCOSE 185* 181*  CALCIUM 8.8* 8.8*     Discharge Medications:   Allergies as of 09/17/2018   No Known Allergies     Medication List    TAKE these medications   acetaminophen 650 MG CR tablet Commonly known as:  TYLENOL Take 1,300 mg by mouth every 8 (eight) hours as needed for pain.   aspirin 325 MG EC  tablet 1 tab a day for the next 30 days to prevent blood clots   atorvastatin 20 MG tablet Commonly known as:  LIPITOR Take 20 mg by mouth daily.   citalopram 20 MG tablet Commonly known as:  CELEXA Take 20 mg by mouth daily.   docusate sodium 100 MG capsule Commonly known as:  COLACE 1 tab 2 times a day while on narcotics.  STOOL SOFTENER   fexofenadine 180 MG tablet Commonly known as:  ALLEGRA Take 180 mg by mouth daily.   gabapentin 300 MG capsule Commonly known as:  NEURONTIN Take 1 capsule (300 mg total) by mouth at bedtime.   oxyCODONE 5 MG immediate release tablet Commonly known as:  Oxy IR/ROXICODONE 1 po q 4 hrs prn pain   polyethylene glycol packet Commonly known as:  MIRALAX / GLYCOLAX 17grams in 6 oz of something to drink twice a day until bowel movement.  LAXITIVE.   Restart if two days since last bowel movement            Discharge Care Instructions  (From admission, onward)         Start     Ordered   09/17/18 0000  Change dressing    Comments:  DO NOT REMOVE BANDAGE OVER SURGICAL INCISION.  [place] WHOLE LEG INCLUDING OVER THE WATERPROOF BANDAGE WITH SOAP AND WATER EVERY DAY.   09/17/18 1254          Diagnostic Studies: No results found.  Disposition: Discharge disposition: 01-Home or Self Care       Discharge Instructions    CPM   Complete by:  As directed    Continuous passive motion machine (CPM):      Use the CPM from 0 to 90 for 6 hours per day.       You may break it up into 2 or 3 sessions per day.      Use CPM for 2 weeks or until you are told to stop.   Call MD / Call 911   Complete by:  As directed    If you experience chest pain or shortness of breath, CALL 911 and be transported to the hospital emergency room.  If you develope a fever above 101 F, pus (white drainage) or increased drainage or redness at the wound, or calf pain, call your surgeon's office.   Change dressing   Complete by:  As directed    DO NOT REMOVE BANDAGE OVER SURGICAL INCISION.  [place] WHOLE LEG INCLUDING OVER THE WATERPROOF BANDAGE WITH SOAP AND WATER EVERY DAY.   Constipation Prevention   Complete by:  As directed    Drink plenty of fluids.  Prune juice may be helpful.  You may use a stool softener, such as Colace (over the counter) 100 mg twice a day.  Use MiraLax (over the counter) for constipation as needed.   Diet - low sodium heart healthy   Complete by:  As directed    Discharge instructions   Complete by:  As directed    INSTRUCTIONS AFTER JOINT REPLACEMENT   Remove items at home which could result in a fall. This includes throw rugs or furniture in walking pathways ICE to the affected joint every three hours while awake for 30 minutes at a time, for at least the first 3-5 days, and then as needed for pain and swelling.  Continue to use  ice for pain and swelling. You may notice swelling that will progress down to the foot and ankle.  This is normal after surgery.  Elevate your leg when you are not up walking on it.   Continue to use the breathing machine you got in the hospital (incentive spirometer) which will help keep your temperature down.  It is common for your temperature to cycle up and down following surgery, especially at night when you are not up moving around and exerting yourself.  The breathing machine keeps your lungs expanded and your temperature down.   DIET:  As you were doing prior to hospitalization, we recommend a well-balanced diet.  DRESSING / WOUND CARE / SHOWERING  Keep the surgical dressing until follow up.  The dressing is water proof, so you can shower without any extra covering.  IF THE DRESSING FALLS OFF or the wound gets wet inside, change the dressing with sterile gauze.  Please use good hand washing techniques before changing the dressing.  Do not use any lotions or creams on the incision until instructed by your surgeon.    ACTIVITY  Increase activity slowly as tolerated, but follow the weight bearing instructions below.   No driving for 6 weeks or until further direction given by your physician.  You cannot drive while taking narcotics.  No lifting or carrying greater than 10 lbs. until further directed by your surgeon. Avoid periods of inactivity such as sitting longer than an hour when not asleep. This helps prevent blood clots.  You may return to work once you are authorized by your doctor.     WEIGHT BEARING   Weight bearing as tolerated with assist device (walker, cane, etc) as directed, use it as long as suggested by your surgeon or therapist, typically at least 2-3 weeks.   EXERCISES  Results after joint replacement surgery are often greatly improved when you follow the exercise, range of motion and muscle strengthening exercises prescribed by your doctor. Safety measures are also  important to protect the joint from further injury. Any time any of these exercises cause you to have increased pain or swelling, decrease what you are doing until you are comfortable again and then slowly increase them. If you have problems or questions, call your caregiver or physical therapist for advice.   Rehabilitation is important following a joint replacement. After just a few days of immobilization, the muscles of the leg can become weakened and shrink (atrophy).  These exercises are designed to build up the tone and strength of the thigh and leg muscles and to improve motion. Often times heat used for twenty to thirty minutes before working out will loosen up your tissues and help with improving the range of motion but do not use heat for the first two weeks following surgery (sometimes heat can increase post-operative swelling).   These exercises can be done on a training (exercise) mat, on the floor, on a table or on a bed. Use whatever works the best and is most comfortable for you.    Use music or television while you are exercising so that the exercises are a pleasant break in your day. This will make your life better with the exercises acting as a break in your routine that you can look forward to.   Perform all exercises about fifteen times, three times per day or as directed.  You should exercise both the operative leg and the other leg as well.   Exercises include:  Quad Sets - Tighten up the muscle on the front of the thigh (Quad) and hold for 5-10 seconds.   Straight  Leg Raises - With your knee straight (if you were given a brace, keep it on), lift the leg to 60 degrees, hold for 3 seconds, and slowly lower the leg.  Perform this exercise against resistance later as your leg gets stronger.  Leg Slides: Lying on your back, slowly slide your foot toward your buttocks, bending your knee up off the floor (only go as far as is comfortable). Then slowly slide your foot back down until your  leg is flat on the floor again.  Angel Wings: Lying on your back spread your legs to the side as far apart as you can without causing discomfort.  Hamstring Strength:  Lying on your back, push your heel against the floor with your leg straight by tightening up the muscles of your buttocks.  Repeat, but this time bend your knee to a comfortable angle, and push your heel against the floor.  You may put a pillow under the heel to make it more comfortable if necessary.   A rehabilitation program following joint replacement surgery can speed recovery and prevent re-injury in the future due to weakened muscles. Contact your doctor or a physical therapist for more information on knee rehabilitation.    CONSTIPATION  Constipation is defined medically as fewer than three stools per week and severe constipation as less than one stool per week.  Even if you have a regular bowel pattern at home, your normal regimen is likely to be disrupted due to multiple reasons following surgery.  Combination of anesthesia, postoperative narcotics, change in appetite and fluid intake all can affect your bowels.   YOU MUST use at least one of the following options; they are listed in order of increasing strength to get the job done.  They are all available over the counter, and you may need to use some, POSSIBLY even all of these options:    Drink plenty of fluids (prune juice may be helpful) and high fiber foods Colace 100 mg by mouth twice a day  Senokot for constipation as directed and as needed Dulcolax (bisacodyl), take with full glass of water  Miralax (polyethylene glycol) once or twice a day as needed.  If you have tried all these things and are unable to have a bowel movement in the first 3-4 days after surgery call either your surgeon or your primary doctor.    If you experience loose stools or diarrhea, hold the medications until you stool forms back up.  If your symptoms do not get better within 1 week or if  they get worse, check with your doctor.  If you experience "the worst abdominal pain ever" or develop nausea or vomiting, please contact the office immediately for further recommendations for treatment.   ITCHING:  If you experience itching with your medications, try taking only a single pain pill, or even half a pain pill at a time.  You can also use Benadryl over the counter for itching or also to help with sleep.   TED HOSE STOCKINGS:  Use stockings on both legs until for at least 2 weeks or as directed by physician office. They may be removed at night for sleeping.  MEDICATIONS:  See your medication summary on the "After Visit Summary" that nursing will review with you.  You may have some home medications which will be placed on hold until you complete the course of blood thinner medication.  It is important for you to complete the blood thinner medication as prescribed.  PRECAUTIONS:  If  you experience chest pain or shortness of breath - call 911 immediately for transfer to the hospital emergency department.   If you develop a fever greater that 101 F, purulent drainage from wound, increased redness or drainage from wound, foul odor from the wound/dressing, or calf pain - CONTACT YOUR SURGEON.                                                   FOLLOW-UP APPOINTMENTS:  If you do not already have a post-op appointment, please call the office for an appointment to be seen by your surgeon.  Guidelines for how soon to be seen are listed in your "After Visit Summary", but are typically between 1-4 weeks after surgery.  OTHER INSTRUCTIONS:   Knee Replacement:  Do not place pillow under knee, focus on keeping the knee straight while resting. CPM instructions: 0-90 degrees, 2 hours in the morning, 2 hours in the afternoon, and 2 hours in the evening. Place foam block, curve side up under heel at all times except when in CPM or when walking.  DO NOT modify, tear, cut, or change the foam block in any  way.  MAKE SURE YOU:  Understand these instructions.  Get help right away if you are not doing well or get worse.    Thank you for letting us be a part of your medical care team.  It is a privilege we respect greatly.  We hope these instructions will help you stay on track for a fast and full recovery!   Do not put a pillow under the knee. Place it under the heel.   Complete by:  As directed    Place gray foam block, curve side up under heel at all times except when in CPM or when walking.  DO NOT modify, tear, cut, or change in any way the gray foam block.   Increase activity slowly as tolerated   Complete by:  As directed    Patient may shower   Complete by:  As directed    Aquacel dressing is water proof    Wash over it and the whole leg with soap and water at the end of your shower   TED hose   Complete by:  As directed    Use stockings (TED hose) for 2 weeks on both leg(s).  You may remove them at night for sleeping.      Follow-up Information    Elsie Saas, MD Follow up on 09/29/2018.   Specialty:  Orthopedic Surgery Why:  arrive at physical therapy at Dr Archie Endo office at 12:30 1 pm appointment then you have a 2:15 pm to see Dr Leotis Pain information: [address] [place] [address] [phone]        Home, [facility] Follow up.   Specialty:  [place] Why:  will see you at home for 5 HHPT visits prior to MD follow up  Contact information: [place] [address] [phone]        [facility] Physical [facility]. Go on 09/29/2018.   Why:  arrive at physical therapy at Dr Archie Endo office at 12:30 1 pm appointment then you have a 2:15 pm to see Dr Leotis Pain information: [place] [address]   [phone]  Signed: Linda Hedges 09/17/2018, 12:57 PM

## 2018-09-17 NOTE — Progress Notes (Signed)
Physical Therapy Treatment Patient Details Name: Fernando Clark MRN: [id number] DOB: 1946-05-28 Today's Date: 09/17/2018    History of Present Illness Admitted for LTKA, [name];  has a past medical history of Arthritis, Bursitis, Depression, GERD (gastroesophageal reflux disease), Headache, HLD (hyperlipidemia), vasectomy, Osteoarthritis, Primary localized osteoarthritis of left knee, Sleep apnea, and Snoring (10/14/2014).    PT Comments    Continuing work on functional mobility and activity tolerance;  Performed therex; Questions solicited and answered; OK for dc home from PT standpoint    Follow Up Recommendations  Follow surgeon's recommendation for DC plan and follow-up therapies;Supervision/Assistance - 24 hour     Equipment Recommendations  Rolling walker with 5" wheels;3in1 (PT)    Recommendations for Other Services       Precautions / Restrictions Precautions Precautions: Knee;Fall Restrictions LLE Weight Bearing: Weight bearing as tolerated    Mobility  Bed Mobility Overal bed mobility: Needs Assistance Bed Mobility: Supine to Sit     Supine to sit: Supervision     General bed mobility comments: no difficulty  Transfers Overall transfer level: Needs assistance Equipment used: Rolling walker (2 wheeled) Transfers: Sit to/from Stand Sit to Stand: Supervision         General transfer comment: Less need for cues for hand placement  Ambulation/Gait Ambulation/Gait assistance: Supervision Gait Distance (Feet): 340 Feet Assistive device: Rolling walker (2 wheeled) Gait Pattern/deviations: Step-through pattern     General Gait Details: Nice, stable knee in stance; Cues to self-monitor for activity tolernace; minguard initially progressing to Supervision; no lightheadedness   Stairs             Wheelchair Mobility    Modified Rankin (Stroke Patients Only)       Balance                                            Cognition  Arousal/Alertness: Awake/alert Behavior During Therapy: WFL for tasks assessed/performed Overall Cognitive Status: Within Functional Limits for tasks assessed                                        Exercises Total Joint Exercises Ankle Circles/Pumps: AROM;Both;10 reps Quad Sets: AROM;Left;10 reps Short Arc Quad: AROM;Left;10 reps Heel Slides: AROM;AAROM;Left;10 reps Hip ABduction/ADduction: AROM;Left;10 reps Straight Leg Raises: AROM;Left;10 reps Goniometric ROM: approx 0-80 deg    General Comments        Pertinent Vitals/Pain Pain Assessment: Faces Faces Pain Scale: Hurts even more Pain Location: L knee with weight bearing Pain Descriptors / Indicators: Aching Pain Intervention(s): Monitored during session    Home Living                      Prior Function            PT Goals (current goals can now be found in the care plan section) Acute Rehab PT Goals Patient Stated Goal: back to golf PT Goal Formulation: With patient Time For Goal Achievement: 09/23/18 Potential to Achieve Goals: Good Progress towards PT goals: Progressing toward goals    Frequency    7X/week      PT Plan Current plan remains appropriate    Co-evaluation              AM-PAC PT "6 Clicks" Daily Activity  Outcome Measure  Difficulty turning over in bed (including adjusting bedclothes, sheets and blankets)?: None Difficulty moving from lying on back to sitting on the side of the bed? : None Difficulty sitting down on and standing up from a chair with arms (e.g., wheelchair, bedside commode, etc,.)?: A Little Help needed moving to and from a bed to chair (including a wheelchair)?: None Help needed walking in hospital room?: None Help needed climbing 3-5 steps with a railing? : A Little 6 Click Score: 22    End of Session Equipment Utilized During Treatment: Gait belt Activity Tolerance: Patient tolerated treatment well Patient left: in bed;with call  bell/phone within reach Nurse Communication: Mobility status PT Visit Diagnosis: Unsteadiness on feet (R26.81);Other abnormalities of gait and mobility (R26.89);Pain Pain - Right/Left: Left Pain - part of body: Knee     Time: 1338-1400 PT Time Calculation (min) (ACUTE ONLY): 22 min  Charges:  $Therapeutic Exercise: 8-22 mins                     Roney Marion, PT  Acute Rehabilitation Services Pager [phone] Office [phone]    [place] 09/17/2018, 3:05 PM

## 2018-09-17 NOTE — Progress Notes (Signed)
Physical Therapy Treatment Patient Details Name: Fernando Clark MRN: [id number] DOB: 01-30-1946 Today's Date: 09/17/2018    History of Present Illness Admitted for LTKA, Fernando Clark;  has a past medical history of Arthritis, Bursitis, Depression, GERD (gastroesophageal reflux disease), Headache, HLD (hyperlipidemia), vasectomy, Osteoarthritis, Primary localized osteoarthritis of left knee, Sleep apnea, and Snoring (10/14/2014).    PT Comments    Continuing work on functional mobility and activity tolerance;  Much improved activity tolerance, and we were able to go over a few different techniques for stair training, to give [name] options for his flight of steps at home; We discussed car transfers as well; in a considerable amount of pain after walking and stairs -- plan to go over therex next session; OK for dc home from PT standpoint   Follow Up Recommendations  Follow surgeon's recommendation for DC plan and follow-up therapies;Supervision/Assistance - 24 hour     Equipment Recommendations  Rolling walker with 5" wheels;3in1 (PT)    Recommendations for Other Services       Precautions / Restrictions Precautions Precautions: Knee;Fall Precaution Booklet Issued: Yes (comment) Precaution Comments: Activity tolerance improving Restrictions LLE Weight Bearing: Weight bearing as tolerated    Mobility  Bed Mobility Overal bed mobility: Needs Assistance Bed Mobility: Supine to Sit     Supine to sit: Supervision     General bed mobility comments: no difficulty  Transfers Overall transfer level: Needs assistance Equipment used: Rolling walker (2 wheeled) Transfers: Sit to/from Stand Sit to Stand: Supervision         General transfer comment: Less need for cues for hand placement  Ambulation/Gait Ambulation/Gait assistance: Min guard;Supervision Gait Distance (Feet): 120 Feet Assistive device: Rolling walker (2 wheeled) Gait Pattern/deviations: Step-through pattern Gait  velocity: slow   General Gait Details: Nice, stable knee in stance; Cues to self-monitor for activity tolernace; minguard initially progressing to Supervision; no lightheadedness   Stairs Stairs: Yes Stairs assistance: Min assist Stair Management: No rails;With walker;Backwards;One rail Left;Sideways Number of Stairs: 18(Total; 6+6+6, with different technqiues) General stair comments: First performed 6 steps backwards with RW and min assist to steady RW; step-by-step cues for technqiue; next, performed 6 steps with one rail L sideways, cues for technqiue; Last performed steps with L hand on wall and R arm around PT's shoulder, to simulate friends' assist; Cues to slow down, but overall pwerformed stairs well   Wheelchair Mobility    Modified Rankin (Stroke Patients Only)       Balance                                            Cognition Arousal/Alertness: Awake/alert Behavior During Therapy: WFL for tasks assessed/performed(slightly impulsive) Overall Cognitive Status: Within Functional Limits for tasks assessed                                        Exercises      General Comments General comments (skin integrity, edema, etc.): We discussed car transfer, and typical checklist/flow of discharge day      Pertinent Vitals/Pain Pain Assessment: 0-10 Pain Score: 8  Pain Location: L knee with weight bearing Pain Descriptors / Indicators: Aching Pain Intervention(s): Ice applied    Home Living  Prior Function            PT Goals (current goals can now be found in the care plan section) Acute Rehab PT Goals Patient Stated Goal: back to golf PT Goal Formulation: With patient Time For Goal Achievement: 09/23/18 Potential to Achieve Goals: Good Progress towards PT goals: Progressing toward goals    Frequency    7X/week      PT Plan Current plan remains appropriate    Co-evaluation               AM-PAC PT "6 Clicks" Daily Activity  Outcome Measure  Difficulty turning over in bed (including adjusting bedclothes, sheets and blankets)?: A Little Difficulty moving from lying on back to sitting on the side of the bed? : None Difficulty sitting down on and standing up from a chair with arms (e.g., wheelchair, bedside commode, etc,.)?: A Little Help needed moving to and from a bed to chair (including a wheelchair)?: None Help needed walking in hospital room?: None Help needed climbing 3-5 steps with a railing? : A Little 6 Click Score: 21    End of Session Equipment Utilized During Treatment: Gait belt Activity Tolerance: Patient tolerated treatment well;Patient limited by pain Patient left: in chair;with call bell/phone within reach Nurse Communication: Mobility status PT Visit Diagnosis: Unsteadiness on feet (R26.81);Other abnormalities of gait and mobility (R26.89);Pain Pain - Right/Left: Left Pain - part of body: Knee     Time: 7078-6754 PT Time Calculation (min) (ACUTE ONLY): 22 min  Charges:  $Gait Training: 8-22 mins                     Roney Marion, PT  Acute Rehabilitation Services Pager [phone] Office [phone]    Colletta [place] 09/17/2018, 8:58 AM

## 2018-09-17 NOTE — Plan of Care (Signed)

## 2018-09-18 DIAGNOSIS — F329 Major depressive disorder, single episode, unspecified: Secondary | ICD-10-CM | POA: Diagnosis not present

## 2018-09-18 DIAGNOSIS — G4730 Sleep apnea, unspecified: Secondary | ICD-10-CM | POA: Diagnosis not present

## 2018-09-18 DIAGNOSIS — K219 Gastro-esophageal reflux disease without esophagitis: Secondary | ICD-10-CM | POA: Diagnosis not present

## 2018-09-18 DIAGNOSIS — K5900 Constipation, unspecified: Secondary | ICD-10-CM | POA: Diagnosis not present

## 2018-09-18 DIAGNOSIS — Z471 Aftercare following joint replacement surgery: Secondary | ICD-10-CM | POA: Diagnosis not present

## 2018-09-18 DIAGNOSIS — G4709 Other insomnia: Secondary | ICD-10-CM | POA: Diagnosis not present

## 2018-09-18 DIAGNOSIS — Z9181 History of falling: Secondary | ICD-10-CM | POA: Diagnosis not present

## 2018-09-18 DIAGNOSIS — Z87891 Personal history of nicotine dependence: Secondary | ICD-10-CM | POA: Diagnosis not present

## 2018-09-18 DIAGNOSIS — E785 Hyperlipidemia, unspecified: Secondary | ICD-10-CM | POA: Diagnosis not present

## 2018-09-18 DIAGNOSIS — Z96652 Presence of left artificial knee joint: Secondary | ICD-10-CM | POA: Diagnosis not present

## 2018-09-18 DIAGNOSIS — I10 Essential (primary) hypertension: Secondary | ICD-10-CM | POA: Diagnosis not present

## 2018-09-18 DIAGNOSIS — Z7982 Long term (current) use of aspirin: Secondary | ICD-10-CM | POA: Diagnosis not present

## 2018-09-19 ENCOUNTER — Encounter (HOSPITAL_COMMUNITY): Payer: Self-pay | Admitting: Orthopedic Surgery

## 2018-09-20 DIAGNOSIS — I10 Essential (primary) hypertension: Secondary | ICD-10-CM | POA: Diagnosis not present

## 2018-09-20 DIAGNOSIS — F329 Major depressive disorder, single episode, unspecified: Secondary | ICD-10-CM | POA: Diagnosis not present

## 2018-09-20 DIAGNOSIS — G4730 Sleep apnea, unspecified: Secondary | ICD-10-CM | POA: Diagnosis not present

## 2018-09-20 DIAGNOSIS — Z471 Aftercare following joint replacement surgery: Secondary | ICD-10-CM | POA: Diagnosis not present

## 2018-09-20 DIAGNOSIS — G4709 Other insomnia: Secondary | ICD-10-CM | POA: Diagnosis not present

## 2018-09-20 DIAGNOSIS — K5900 Constipation, unspecified: Secondary | ICD-10-CM | POA: Diagnosis not present

## 2018-09-22 DIAGNOSIS — F329 Major depressive disorder, single episode, unspecified: Secondary | ICD-10-CM | POA: Diagnosis not present

## 2018-09-22 DIAGNOSIS — Z471 Aftercare following joint replacement surgery: Secondary | ICD-10-CM | POA: Diagnosis not present

## 2018-09-22 DIAGNOSIS — I10 Essential (primary) hypertension: Secondary | ICD-10-CM | POA: Diagnosis not present

## 2018-09-22 DIAGNOSIS — G4730 Sleep apnea, unspecified: Secondary | ICD-10-CM | POA: Diagnosis not present

## 2018-09-22 DIAGNOSIS — K5900 Constipation, unspecified: Secondary | ICD-10-CM | POA: Diagnosis not present

## 2018-09-22 DIAGNOSIS — G4709 Other insomnia: Secondary | ICD-10-CM | POA: Diagnosis not present

## 2018-09-24 DIAGNOSIS — F329 Major depressive disorder, single episode, unspecified: Secondary | ICD-10-CM | POA: Diagnosis not present

## 2018-09-24 DIAGNOSIS — Z471 Aftercare following joint replacement surgery: Secondary | ICD-10-CM | POA: Diagnosis not present

## 2018-09-24 DIAGNOSIS — G4730 Sleep apnea, unspecified: Secondary | ICD-10-CM | POA: Diagnosis not present

## 2018-09-24 DIAGNOSIS — G4709 Other insomnia: Secondary | ICD-10-CM | POA: Diagnosis not present

## 2018-09-24 DIAGNOSIS — K5900 Constipation, unspecified: Secondary | ICD-10-CM | POA: Diagnosis not present

## 2018-09-24 DIAGNOSIS — I10 Essential (primary) hypertension: Secondary | ICD-10-CM | POA: Diagnosis not present

## 2018-09-25 DIAGNOSIS — F329 Major depressive disorder, single episode, unspecified: Secondary | ICD-10-CM | POA: Diagnosis not present

## 2018-09-25 DIAGNOSIS — G4730 Sleep apnea, unspecified: Secondary | ICD-10-CM | POA: Diagnosis not present

## 2018-09-25 DIAGNOSIS — Z471 Aftercare following joint replacement surgery: Secondary | ICD-10-CM | POA: Diagnosis not present

## 2018-09-25 DIAGNOSIS — K5900 Constipation, unspecified: Secondary | ICD-10-CM | POA: Diagnosis not present

## 2018-09-25 DIAGNOSIS — I10 Essential (primary) hypertension: Secondary | ICD-10-CM | POA: Diagnosis not present

## 2018-09-25 DIAGNOSIS — G4709 Other insomnia: Secondary | ICD-10-CM | POA: Diagnosis not present

## 2018-09-29 DIAGNOSIS — M6281 Muscle weakness (generalized): Secondary | ICD-10-CM | POA: Diagnosis not present

## 2018-09-29 DIAGNOSIS — M25562 Pain in left knee: Secondary | ICD-10-CM | POA: Diagnosis not present

## 2018-09-29 DIAGNOSIS — M1712 Unilateral primary osteoarthritis, left knee: Secondary | ICD-10-CM | POA: Diagnosis not present

## 2018-09-29 DIAGNOSIS — R262 Difficulty in walking, not elsewhere classified: Secondary | ICD-10-CM | POA: Diagnosis not present

## 2018-09-29 DIAGNOSIS — M25662 Stiffness of left knee, not elsewhere classified: Secondary | ICD-10-CM | POA: Diagnosis not present

## 2018-10-01 DIAGNOSIS — R262 Difficulty in walking, not elsewhere classified: Secondary | ICD-10-CM | POA: Diagnosis not present

## 2018-10-01 DIAGNOSIS — M6281 Muscle weakness (generalized): Secondary | ICD-10-CM | POA: Diagnosis not present

## 2018-10-01 DIAGNOSIS — M25562 Pain in left knee: Secondary | ICD-10-CM | POA: Diagnosis not present

## 2018-10-01 DIAGNOSIS — M25662 Stiffness of left knee, not elsewhere classified: Secondary | ICD-10-CM | POA: Diagnosis not present

## 2018-10-03 DIAGNOSIS — R262 Difficulty in walking, not elsewhere classified: Secondary | ICD-10-CM | POA: Diagnosis not present

## 2018-10-03 DIAGNOSIS — M6281 Muscle weakness (generalized): Secondary | ICD-10-CM | POA: Diagnosis not present

## 2018-10-03 DIAGNOSIS — M25662 Stiffness of left knee, not elsewhere classified: Secondary | ICD-10-CM | POA: Diagnosis not present

## 2018-10-03 DIAGNOSIS — M25562 Pain in left knee: Secondary | ICD-10-CM | POA: Diagnosis not present

## 2018-10-06 DIAGNOSIS — M25662 Stiffness of left knee, not elsewhere classified: Secondary | ICD-10-CM | POA: Diagnosis not present

## 2018-10-06 DIAGNOSIS — M6281 Muscle weakness (generalized): Secondary | ICD-10-CM | POA: Diagnosis not present

## 2018-10-06 DIAGNOSIS — R262 Difficulty in walking, not elsewhere classified: Secondary | ICD-10-CM | POA: Diagnosis not present

## 2018-10-06 DIAGNOSIS — M25562 Pain in left knee: Secondary | ICD-10-CM | POA: Diagnosis not present

## 2018-10-08 DIAGNOSIS — R262 Difficulty in walking, not elsewhere classified: Secondary | ICD-10-CM | POA: Diagnosis not present

## 2018-10-08 DIAGNOSIS — M25562 Pain in left knee: Secondary | ICD-10-CM | POA: Diagnosis not present

## 2018-10-08 DIAGNOSIS — M25662 Stiffness of left knee, not elsewhere classified: Secondary | ICD-10-CM | POA: Diagnosis not present

## 2018-10-08 DIAGNOSIS — M6281 Muscle weakness (generalized): Secondary | ICD-10-CM | POA: Diagnosis not present

## 2018-10-14 DIAGNOSIS — M25562 Pain in left knee: Secondary | ICD-10-CM | POA: Diagnosis not present

## 2018-10-14 DIAGNOSIS — M6281 Muscle weakness (generalized): Secondary | ICD-10-CM | POA: Diagnosis not present

## 2018-10-14 DIAGNOSIS — M25662 Stiffness of left knee, not elsewhere classified: Secondary | ICD-10-CM | POA: Diagnosis not present

## 2018-10-14 DIAGNOSIS — R262 Difficulty in walking, not elsewhere classified: Secondary | ICD-10-CM | POA: Diagnosis not present

## 2018-10-16 DIAGNOSIS — M6281 Muscle weakness (generalized): Secondary | ICD-10-CM | POA: Diagnosis not present

## 2018-10-16 DIAGNOSIS — M25662 Stiffness of left knee, not elsewhere classified: Secondary | ICD-10-CM | POA: Diagnosis not present

## 2018-10-16 DIAGNOSIS — R262 Difficulty in walking, not elsewhere classified: Secondary | ICD-10-CM | POA: Diagnosis not present

## 2018-10-16 DIAGNOSIS — M25562 Pain in left knee: Secondary | ICD-10-CM | POA: Diagnosis not present

## 2018-10-21 DIAGNOSIS — M25662 Stiffness of left knee, not elsewhere classified: Secondary | ICD-10-CM | POA: Diagnosis not present

## 2018-10-21 DIAGNOSIS — M25562 Pain in left knee: Secondary | ICD-10-CM | POA: Diagnosis not present

## 2018-10-21 DIAGNOSIS — R262 Difficulty in walking, not elsewhere classified: Secondary | ICD-10-CM | POA: Diagnosis not present

## 2018-10-21 DIAGNOSIS — M6281 Muscle weakness (generalized): Secondary | ICD-10-CM | POA: Diagnosis not present

## 2018-10-23 DIAGNOSIS — M25562 Pain in left knee: Secondary | ICD-10-CM | POA: Diagnosis not present

## 2018-10-23 DIAGNOSIS — M6281 Muscle weakness (generalized): Secondary | ICD-10-CM | POA: Diagnosis not present

## 2018-10-23 DIAGNOSIS — R262 Difficulty in walking, not elsewhere classified: Secondary | ICD-10-CM | POA: Diagnosis not present

## 2018-10-23 DIAGNOSIS — M25662 Stiffness of left knee, not elsewhere classified: Secondary | ICD-10-CM | POA: Diagnosis not present

## 2018-10-27 DIAGNOSIS — M25562 Pain in left knee: Secondary | ICD-10-CM | POA: Diagnosis not present

## 2018-10-28 DIAGNOSIS — M25562 Pain in left knee: Secondary | ICD-10-CM | POA: Diagnosis not present

## 2018-10-28 DIAGNOSIS — R262 Difficulty in walking, not elsewhere classified: Secondary | ICD-10-CM | POA: Diagnosis not present

## 2018-10-28 DIAGNOSIS — M6281 Muscle weakness (generalized): Secondary | ICD-10-CM | POA: Diagnosis not present

## 2018-10-28 DIAGNOSIS — M25662 Stiffness of left knee, not elsewhere classified: Secondary | ICD-10-CM | POA: Diagnosis not present

## 2018-10-30 DIAGNOSIS — M25562 Pain in left knee: Secondary | ICD-10-CM | POA: Diagnosis not present

## 2018-10-30 DIAGNOSIS — M25662 Stiffness of left knee, not elsewhere classified: Secondary | ICD-10-CM | POA: Diagnosis not present

## 2018-10-30 DIAGNOSIS — M6281 Muscle weakness (generalized): Secondary | ICD-10-CM | POA: Diagnosis not present

## 2018-10-30 DIAGNOSIS — R262 Difficulty in walking, not elsewhere classified: Secondary | ICD-10-CM | POA: Diagnosis not present

## 2018-10-31 ENCOUNTER — Ambulatory Visit: Payer: No Typology Code available for payment source | Admitting: Adult Health

## 2018-11-03 DIAGNOSIS — M25662 Stiffness of left knee, not elsewhere classified: Secondary | ICD-10-CM | POA: Diagnosis not present

## 2018-11-03 DIAGNOSIS — R262 Difficulty in walking, not elsewhere classified: Secondary | ICD-10-CM | POA: Diagnosis not present

## 2018-11-03 DIAGNOSIS — M6281 Muscle weakness (generalized): Secondary | ICD-10-CM | POA: Diagnosis not present

## 2018-11-03 DIAGNOSIS — M25562 Pain in left knee: Secondary | ICD-10-CM | POA: Diagnosis not present

## 2018-11-09 ENCOUNTER — Encounter: Payer: Self-pay | Admitting: Adult Health

## 2018-11-13 ENCOUNTER — Encounter: Payer: Self-pay | Admitting: Adult Health

## 2018-11-13 ENCOUNTER — Ambulatory Visit (INDEPENDENT_AMBULATORY_CARE_PROVIDER_SITE_OTHER): Payer: Medicare Other | Admitting: Adult Health

## 2018-11-13 VITALS — BP 130/83 | HR 81 | Ht 73.0 in | Wt 260.2 lb

## 2018-11-13 DIAGNOSIS — Z9989 Dependence on other enabling machines and devices: Secondary | ICD-10-CM | POA: Diagnosis not present

## 2018-11-13 DIAGNOSIS — G4733 Obstructive sleep apnea (adult) (pediatric): Secondary | ICD-10-CM

## 2018-11-13 NOTE — Patient Instructions (Signed)

## 2018-11-13 NOTE — Progress Notes (Signed)
PATIENT: Fernando Clark DOB: 07/02/1946  REASON FOR VISIT: follow up HISTORY FROM: patient  HISTORY OF PRESENT ILLNESS: Today 11/13/18:  [name] is a 73 year old male with a history of obstructive sleep apnea on CPAP.  He returns today for follow-up.  His CPAP download indicates that he uses machine 26 out of 30 days for compliance of 87%.  He uses machine greater than 4 hours 22 out of 30 days for compliance of 73%.  On average he uses his machine 5 hours and 34 minutes.  His residual AHI is 0.9 on 7 to 14 cm of water with EPR of 2.  His leak in the 95th percentile is 23.3 L/min.  Overall the patient states that he tolerates the CPAP well although he does not necessarily see the benefit at this time.  The patient is also questioning if he has to use the CPAP lifelong or if there are other options for him.  He returns today for an evaluation.    HISTORY (Copied from Dr.Dohmeier's note)   Fernando Clark is a 73 y.o. male patient who is  seen here on 08-04-2018  in a referral from Dr. Virgina Jock for Sob, fatigue and Insomnia.   I had the pleasure of meeting with Fernando Clark in December 2015 at the time he also reported fatigue and sleepiness but also nocturnal snoring.  His wife had noticed that his she did not notice that he had any apneas.  He usually slept for 5 hours only at night and he shares the bed in 2015 but this with his wife but also was too large dogs.  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  Used to smoke until about 15 years ago but had quit.  He kept active playing golf, that he has in the meantime developed a new problem the inability to fall asleep even when he feels very fatigued and ready to go.  He is also divorced now he has 3 adult children 5 grandchildren, he remains a non-smoker, he drinks caffeine.  He had been evaluated all polysomnography on December 12, 2014 which revealed no significant apnea but RDI also called respiratory  disturbance index.  This is snoring related upper airway resistance he arousals related to upper airway resistance he can be treated by oral appliance ENT procedure CPAP may also help but cannot be insurance approved in today's climate without the underlying diagnosis of apnea being documented.  Apnea he did not have.  I also asked to refer the patient for a sleep psychology referral since insomnia was 1 of the main concerns for years ago already.  His cardiologist had stated that he does not sleep well, that he feels sleepy and drowsy all day long that he is highly fatigued.  He still present occasionally with higher blood pressures than desired.  His last BMI was 33.5, blood pressure on 05 June 2018 have been 154/90 mmHg.  He was screened for abdominal aortic aneurysm negative.  He had normal BUN and creatinine, glucose levels, sodium and potassium levels, vitamin D was normal platelet count was 208, hemoglobin/ hematocrit were normal levels TSH was normal. Complete echocardiogram was normal.  Chief complaint according to patient : "I just can't sleep " I haven't slept without medication in a while. My memory is poor.   Sleep habits are as follows:  Has supper without alcohol, bedtime is now between 8 and 9 PM and he may find himself  still awake at 11 PM, with OTC sleep aids. He feels better when taking melatonin. Stays asleep ( in company of his large dog) until 5 AM. One bathroom break each night . Bedroom is cool , quiet and dark,, sleeps prone.  He rises at 6.30 AM. He averages 6 hours of sleep.  He is not refreshed, not restored- he dreams.  No naps in daytime.   Sleep medical history and family sleep history:  Was evaluated for OSA in 2015 and had UARS , not apnea.    Social history: Divorced after 12 years of marriage. Lives with his dog, alone. Retired  In 2017 .  Quit drinking, quit smoking, caffeine- sweet tea 3 glasses each meal.  9 glasses per day .    REVIEW OF SYSTEMS: Out of  a complete 14 system review of symptoms, the patient complains only of the following symptoms, and all other reviewed systems are negative.  Epworth sleepiness score 7  ALLERGIES: No Known Allergies  HOME MEDICATIONS: Outpatient Medications Prior to Visit  Medication Sig Dispense Refill  . acetaminophen (TYLENOL) 650 MG CR tablet Take 1,300 mg by mouth every 8 (eight) hours as needed for pain.    Marland Kitchen aspirin EC 325 MG EC tablet 1 tab a day for the next 30 days to prevent blood clots 30 tablet 0  . atorvastatin (LIPITOR) 20 MG tablet Take 20 mg by mouth daily.    . citalopram (CELEXA) 20 MG tablet Take 20 mg by mouth daily.    Marland Kitchen docusate sodium (COLACE) 100 MG capsule 1 tab 2 times a day while on narcotics.  STOOL SOFTENER 10 capsule 0  . fexofenadine (ALLEGRA) 180 MG tablet Take 180 mg by mouth daily.    Marland Kitchen gabapentin (NEURONTIN) 300 MG capsule Take 1 capsule (300 mg total) by mouth at bedtime. 30 capsule 1  . oxyCODONE (OXY IR/ROXICODONE) 5 MG immediate release tablet 1 po q 4 hrs prn pain 42 tablet 0  . polyethylene glycol (MIRALAX / GLYCOLAX) packet 17grams in 6 oz of something to drink twice a day until bowel movement.  LAXITIVE.  Restart if two days since last bowel movement 14 each 0   No facility-administered medications prior to visit.     PAST MEDICAL HISTORY: Past Medical History:  Diagnosis Date  . Arthritis    "hands" (09/15/2018)  . Bursitis    R heel  . Depression    tempermental mood  . GERD (gastroesophageal reflux disease)   . Headache   . HLD (hyperlipidemia)   . Hx of vasectomy   . Osteoarthritis    L knee  . Primary localized osteoarthritis of left knee   . Sleep apnea    "I'm getting mask" (09/15/2018)  . Snoring 10/14/2014    PAST SURGICAL HISTORY: Past Surgical History:  Procedure Laterality Date  . APPENDECTOMY    . CATARACT EXTRACTION W/ INTRAOCULAR LENS  IMPLANT, BILATERAL Bilateral   . CHOLECYSTECTOMY OPEN    . COLONOSCOPY  ~ 2009; 2019   [facility] Daine Gip; in [place]  . ESOPHAGOGASTRODUODENOSCOPY  02/2015  . JOINT REPLACEMENT    . KNEE ARTHROSCOPY Left 1985  . TONSILLECTOMY AND ADENOIDECTOMY    . TOTAL KNEE ARTHROPLASTY Left 09/15/2018  . TOTAL KNEE ARTHROPLASTY Left 09/15/2018   Procedure: TOTAL KNEE ARTHROPLASTY;  Surgeon: Elsie Saas, MD;  Location: [facility];  Service: Orthopedics;  Laterality: Left;    FAMILY HISTORY: Family History  Problem Relation Age of Onset  . Arthritis  Mother        lived to 63  . Breast cancer Mother   . Depression Mother   . Hypertension Mother   . Depression Father   . Stroke Father   . Anuerysm Father   . Arthritis Sister   . Cancer Unknown        grandparent  . Prostate cancer Paternal Grandfather   . Heart attack Maternal Grandfather   . Colon cancer Neg Hx   . Rectal cancer Neg Hx   . Stomach cancer Neg Hx   . Esophageal cancer Neg Hx     SOCIAL HISTORY: Social History   Socioeconomic History  . Marital status: Divorced    Spouse name: Not on file  . Number of children: 3  . Years of education: Not on file  . Highest education level: Bachelor's degree (e.g., BA, AB, BS)  Occupational History  . Occupation: retired  Scientific laboratory technician  . Financial resource strain: Not on file  . Food insecurity:    Worry: Not on file    Inability: Not on file  . Transportation needs:    Medical: Not on file    Non-medical: Not on file  Tobacco Use  . Smoking status: Former Smoker    Packs/day: 3.00    Years: 40.00    Pack years: 120.00    Types: Cigarettes    Last attempt to quit: 11/12/2005    Years since quitting: 13.0  . Smokeless tobacco: Former Systems developer    Types: Chew  Substance and Sexual Activity  . Alcohol use: Yes    Alcohol/week: 2.0 standard drinks    Types: 2 Standard drinks or equivalent per week  . Drug use: Never  . Sexual activity: Yes  Lifestyle  . Physical activity:    Days per week: Not on file    Minutes per session: Not on file  . Stress: Not on file    Relationships  . Social connections:    Talks on phone: Not on file    Gets together: Not on file    Attends religious service: Not on file    Active member of club or organization: Not on file    Attends meetings of clubs or organizations: Not on file    Relationship status: Not on file  . Intimate partner violence:    Fear of current or ex partner: Not on file    Emotionally abused: Not on file    Physically abused: Not on file    Forced sexual activity: Not on file  Other Topics Concern  . Not on file  Social History Narrative   Married, 3 kids, 5 grandkids. Retired. (farmer, Customer service manager).  Caffeine 4 cups daily.      Patient is right-handed. He is divorced, lives alone in a 2 story house. He golfs 1-2 x a week when able due to his knee problem.      PHYSICAL EXAM  Vitals:   11/13/18 0811  BP: 130/83  Pulse: 81  Weight: 260 lb 3.2 oz (118 kg)  Height: 6\' 1"  (1.854 m)   Body mass index is 34.33 kg/m.  Generalized: Well developed, in no acute distress   Neurological examination  Mentation: Alert oriented to time, place, history taking. Follows all commands speech and language fluent Cranial nerve II-XII:  Extraocular movements were full, visual field were full on confrontational test. Facial sensation and strength were normal. Uvula tongue midline. Head turning and shoulder shrug  were normal and symmetric.  Neck  circumference 17 inches, Mallampati 4+ Motor: The motor testing reveals 5 over 5 strength of all 4 extremities. Good symmetric motor tone is noted throughout.  Sensory: Sensory testing is intact to soft touch on all 4 extremities. No evidence of extinction is noted.  Gait and station: Gait is normal.    DIAGNOSTIC DATA (LABS, IMAGING, TESTING) - I reviewed patient records, labs, notes, testing and imaging myself where available.  Lab Results  Component Value Date   WBC 18.2 (H) 09/17/2018   HGB 10.6 (L) 09/17/2018   HCT 32.2 (L) 09/17/2018   MCV 93.9  09/17/2018   PLT 180 09/17/2018      Component Value Date/Time   NA 137 09/17/2018 0312   K 4.5 09/17/2018 0312   CL 103 09/17/2018 0312   CO2 29 09/17/2018 0312   GLUCOSE 181 (H) 09/17/2018 0312   BUN 14 09/17/2018 0312   CREATININE 0.75 09/17/2018 0312   CALCIUM 8.8 (L) 09/17/2018 0312   PROT 6.5 09/04/2018 1109   ALBUMIN 4.0 09/04/2018 1109   AST 21 09/04/2018 1109   ALT 21 09/04/2018 1109   ALKPHOS 55 09/04/2018 1109   BILITOT 0.8 09/04/2018 1109   GFRNONAA >60 09/17/2018 0312   GFRAA >60 09/17/2018 0312      ASSESSMENT AND PLAN 73 y.o. year old male  has a past medical history of Arthritis, Bursitis, Depression, GERD (gastroesophageal reflux disease), Headache, HLD (hyperlipidemia), vasectomy, Osteoarthritis, Primary localized osteoarthritis of left knee, Sleep apnea, and Snoring (10/14/2014). here with:  1.  Obstructive sleep apnea on CPAP  The patient CPAP download indicates suboptimal compliance.  The patient is encouraged to use the machine nightly and greater than 4 hours each night.  The patient states that he will try losing weight.  At the next visit if he has had sufficient weight loss we may consider repeating a home sleep test.  The patient may also be interested in a dental device in the future.  He is advised that if his symptoms worsen or he develops new symptoms he should let us know.  He will follow-up in 6 months or sooner if needed.  I spent 15 minutes with the patient. 50% of this time was spent reviewing CPAP download   Ward Givens, MSN, NP-C 11/13/2018, 8:07 AM [facility] [address], [place], [place] [address] [phone]

## 2018-11-21 DIAGNOSIS — J019 Acute sinusitis, unspecified: Secondary | ICD-10-CM | POA: Diagnosis not present

## 2019-01-06 DIAGNOSIS — J019 Acute sinusitis, unspecified: Secondary | ICD-10-CM | POA: Diagnosis not present

## 2019-01-20 DIAGNOSIS — R82998 Other abnormal findings in urine: Secondary | ICD-10-CM | POA: Diagnosis not present

## 2019-05-18 ENCOUNTER — Ambulatory Visit: Payer: Medicare Other | Admitting: Adult Health

## 2019-06-04 DIAGNOSIS — L255 Unspecified contact dermatitis due to plants, except food: Secondary | ICD-10-CM | POA: Diagnosis not present

## 2019-06-23 ENCOUNTER — Other Ambulatory Visit: Payer: Self-pay | Admitting: Internal Medicine

## 2019-06-23 DIAGNOSIS — R918 Other nonspecific abnormal finding of lung field: Secondary | ICD-10-CM

## 2019-12-01 DIAGNOSIS — R519 Headache, unspecified: Secondary | ICD-10-CM | POA: Insufficient documentation

## 2019-12-21 ENCOUNTER — Ambulatory Visit: Payer: Medicare Other

## 2020-01-06 ENCOUNTER — Other Ambulatory Visit: Payer: Self-pay

## 2020-01-06 ENCOUNTER — Ambulatory Visit
Admission: RE | Admit: 2020-01-06 | Discharge: 2020-01-06 | Disposition: A | Discharge status: Home / Self Care | Payer: Medicare Other | Source: Ambulatory Visit | Attending: Internal Medicine | Admitting: Internal Medicine

## 2020-01-06 DIAGNOSIS — R918 Other nonspecific abnormal finding of lung field: Secondary | ICD-10-CM

## 2020-01-06 IMAGING — CT CT CHEST LUNG CANCER SCREENING LOW DOSE W/O CM
1 of 2 series · 10 of 40 positions shown, 13 images · non-contrast
Comparison: 06/12/2018 screening chest CT.

CLINICAL DATA: 73-year-old asymptomatic male former smoker with 68
pack-year smoking history, quit smoking 14 years prior.

EXAM:
CT CHEST WITHOUT CONTRAST LOW-DOSE FOR LUNG CANCER SCREENING
TECHNIQUE: Multidetector CT imaging of the chest was performed following the
standard protocol without IV contrast.

[ct lung segmentation data · axial · 0.81mm/px · z∈[-350,-350]mm · 10 of 326 frames shown]
[frame 1/326  mediastinal]
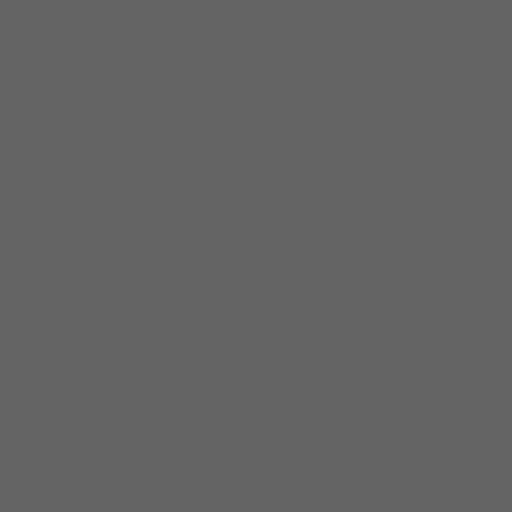
[frame 1/326  lung]
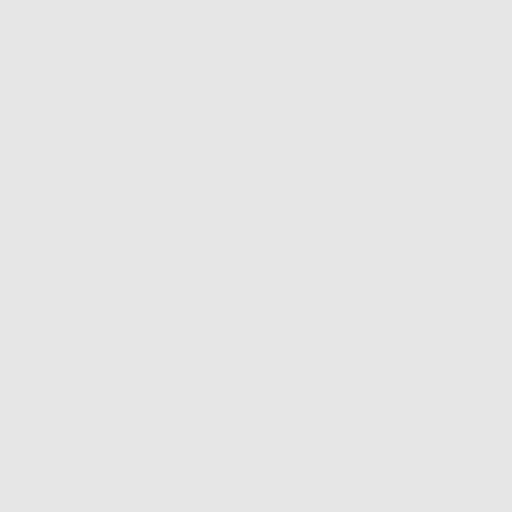
[frame 37/326  lung]
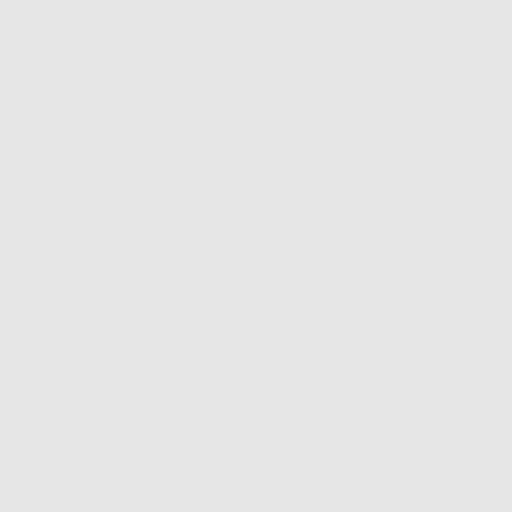
[frame 73/326  lung]
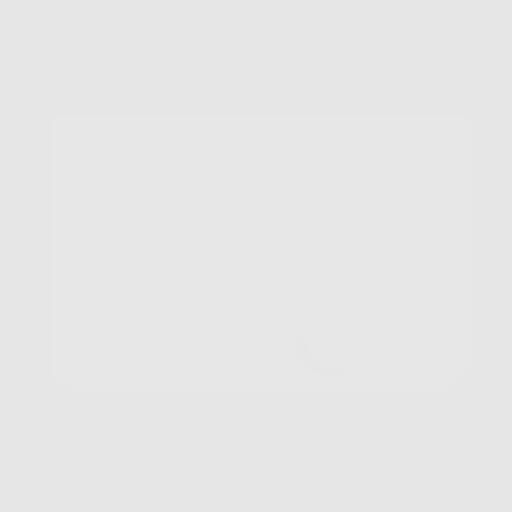
[frame 109/326  lung]
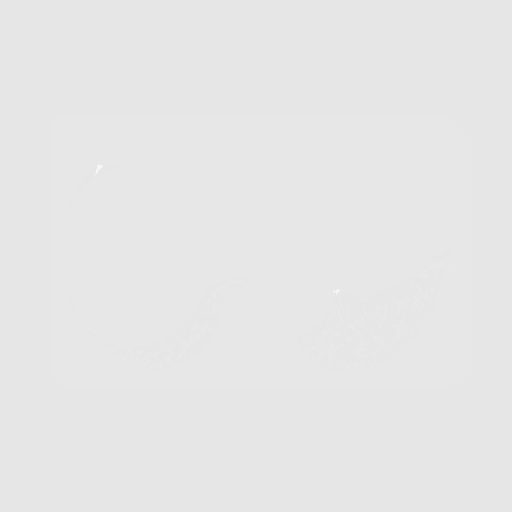
[frame 145/326  mediastinal]
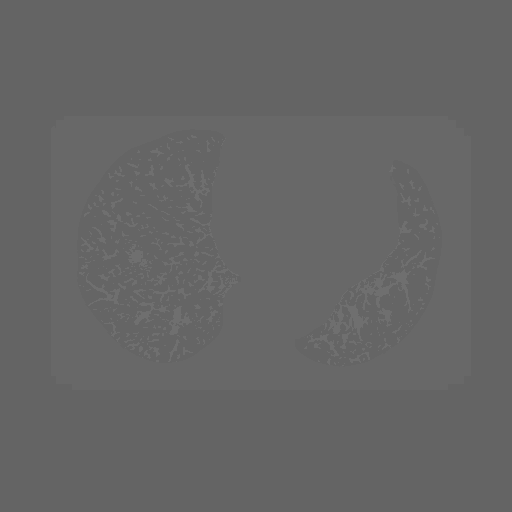
[frame 145/326  lung]
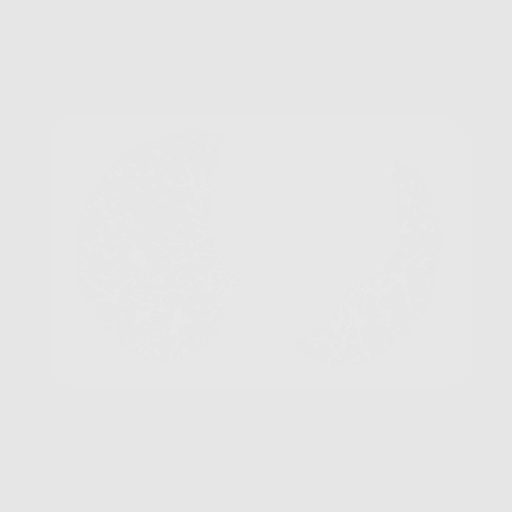
[frame 181/326  lung]
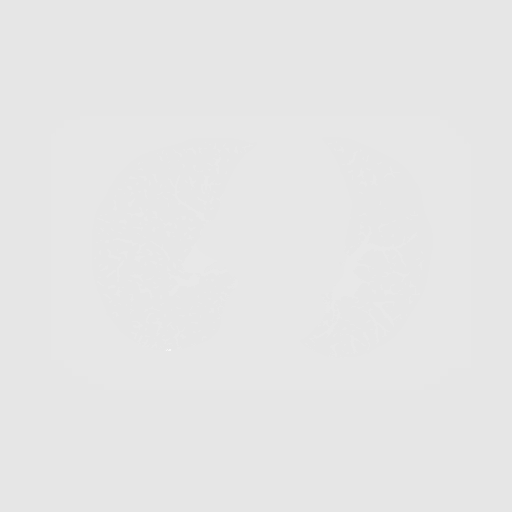
[frame 217/326  lung]
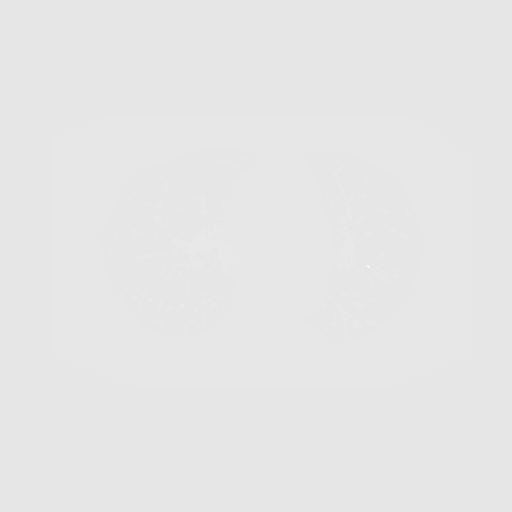
[frame 253/326  lung]
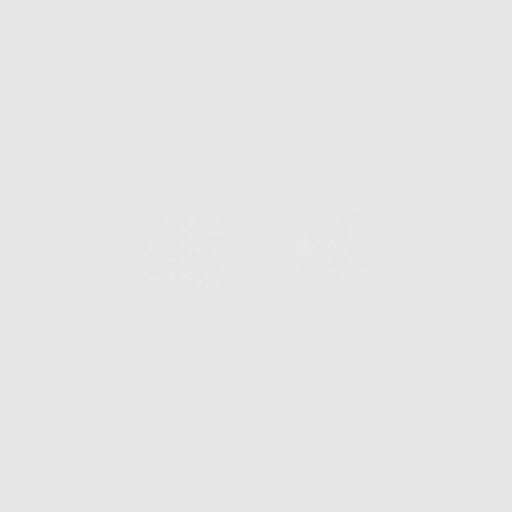
[frame 289/326  mediastinal]
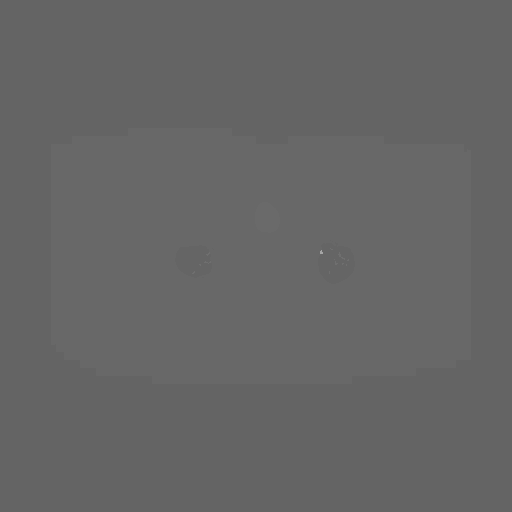
[frame 289/326  lung]
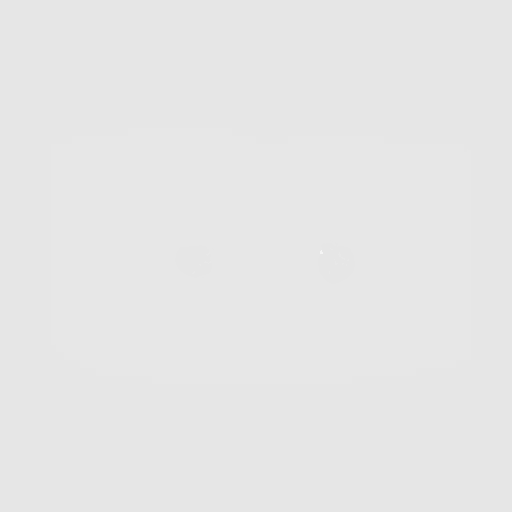
[frame 326/326  lung]
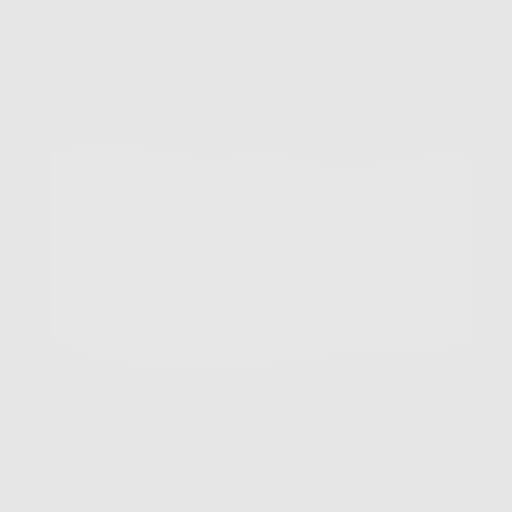

[10 of 40 positions shown; findings below may reference images not displayed]

FINDINGS: Cardiovascular: Normal heart size. No significant pericardial
effusion/thickening. Left anterior descending coronary
atherosclerosis. Atherosclerotic nonaneurysmal thoracic aorta.
Normal caliber pulmonary arteries.

Mediastinum/Nodes: No discrete thyroid nodules. Unremarkable
esophagus. No pathologically enlarged axillary, mediastinal or hilar
lymph nodes, noting limited sensitivity for the detection of hilar
adenopathy on this noncontrast study.

Lungs/Pleura: No pneumothorax. No pleural effusion. Mild
centrilobular emphysema. No acute consolidative airspace disease or
lung masses. No significant growth of previously visualized small
solid right pulmonary nodule. No new significant pulmonary nodules.

Upper abdomen: Cholecystectomy.

Musculoskeletal: No aggressive appearing focal osseous lesions. Mild
thoracic spondylosis. Symmetric mild bilateral gynecomastia, stable.
IMPRESSION: 1. Lung-RADS 2, benign appearance or behavior. Continue annual
screening with low-dose chest CT without contrast in 12 months.
2. One vessel coronary atherosclerosis.
3. Aortic Atherosclerosis (NZ62M-BRZ.Z) and Emphysema (NZ62M-SEE.R).

## 2020-02-03 ENCOUNTER — Other Ambulatory Visit (HOSPITAL_COMMUNITY): Payer: Self-pay | Admitting: Orthopedic Surgery

## 2020-02-03 ENCOUNTER — Other Ambulatory Visit: Payer: Self-pay

## 2020-02-03 ENCOUNTER — Ambulatory Visit (HOSPITAL_COMMUNITY)
Admission: RE | Admit: 2020-02-03 | Discharge: 2020-02-03 | Disposition: A | Discharge status: Home / Self Care | Payer: Medicare Other | Source: Ambulatory Visit | Attending: Orthopedic Surgery | Admitting: Orthopedic Surgery

## 2020-02-03 DIAGNOSIS — M79604 Pain in right leg: Secondary | ICD-10-CM | POA: Diagnosis present

## 2020-02-03 DIAGNOSIS — M7989 Other specified soft tissue disorders: Secondary | ICD-10-CM | POA: Insufficient documentation

## 2020-02-03 NOTE — Progress Notes (Signed)
Lower extremity venous has been completed.   Preliminary results in CV Proc.   Abram Sander 02/03/2020 4:20 PM

## 2021-02-15 ENCOUNTER — Other Ambulatory Visit: Payer: Self-pay | Admitting: Internal Medicine

## 2021-02-15 DIAGNOSIS — Z87891 Personal history of nicotine dependence: Secondary | ICD-10-CM

## 2021-03-07 ENCOUNTER — Ambulatory Visit
Admission: RE | Admit: 2021-03-07 | Discharge: 2021-03-07 | Disposition: A | Discharge status: Home / Self Care | Payer: Medicare Other | Source: Ambulatory Visit | Attending: Internal Medicine | Admitting: Internal Medicine

## 2021-03-07 DIAGNOSIS — Z87891 Personal history of nicotine dependence: Secondary | ICD-10-CM

## 2021-03-07 IMAGING — US US ABDOMINAL AORTA SCREENING AAA
1 series · 7 of 7 positions shown · non-contrast
Comparison: Noncontrast chest CT 06/12/2018.

CLINICAL DATA: 75-year-old male with smoking history.

EXAM:
US ABDOMINAL AORTA MEDICARE SCREENING
TECHNIQUE: Ultrasound examination of the abdominal aorta was performed as a
screening evaluation for abdominal aortic aneurysm.

[Series 1: us abdominal aorta screening aaa · 0.34mm/px · 7 of 7 slices shown]
[im 1/7]
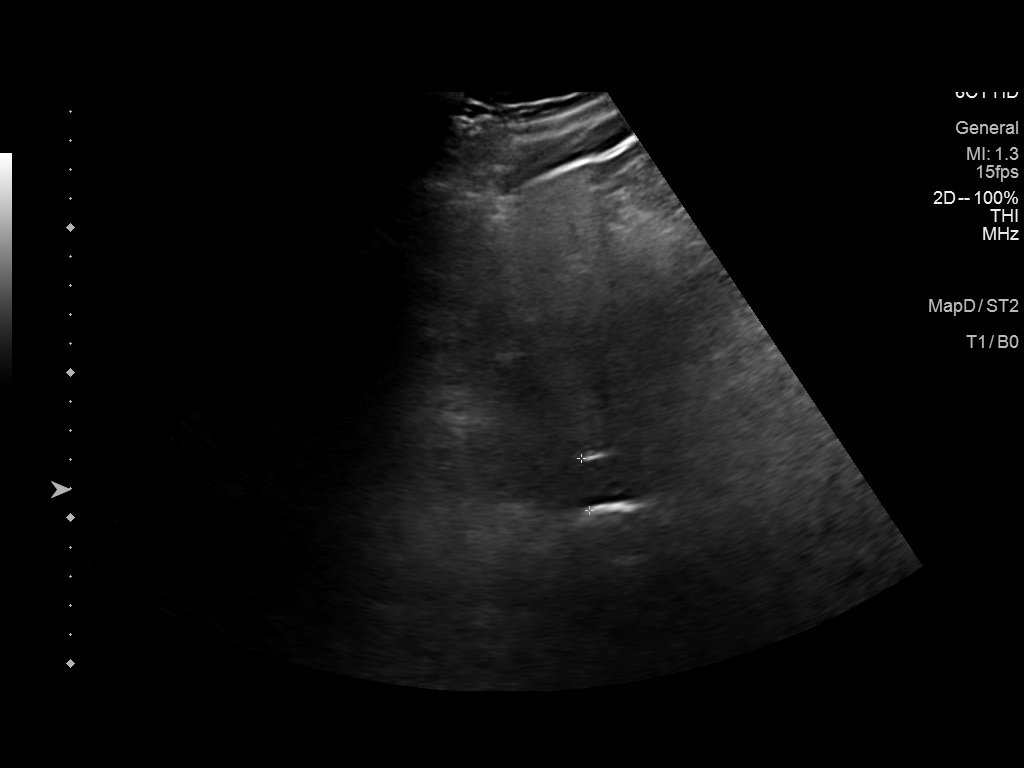
[im 2/7]
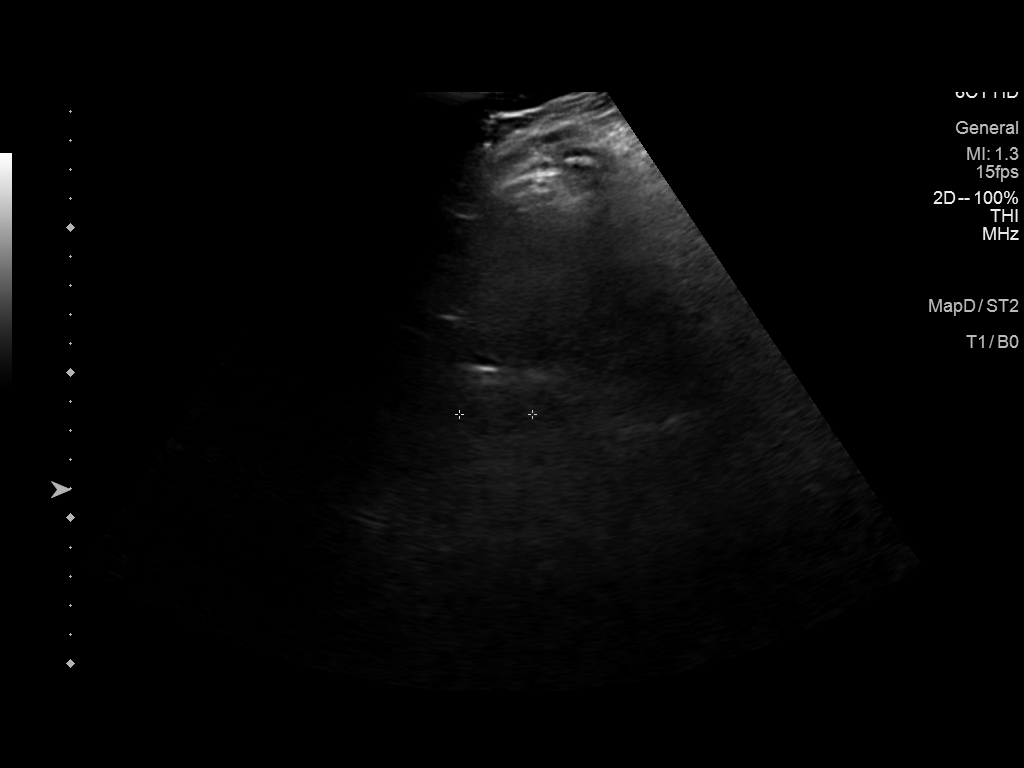
[im 3/7]
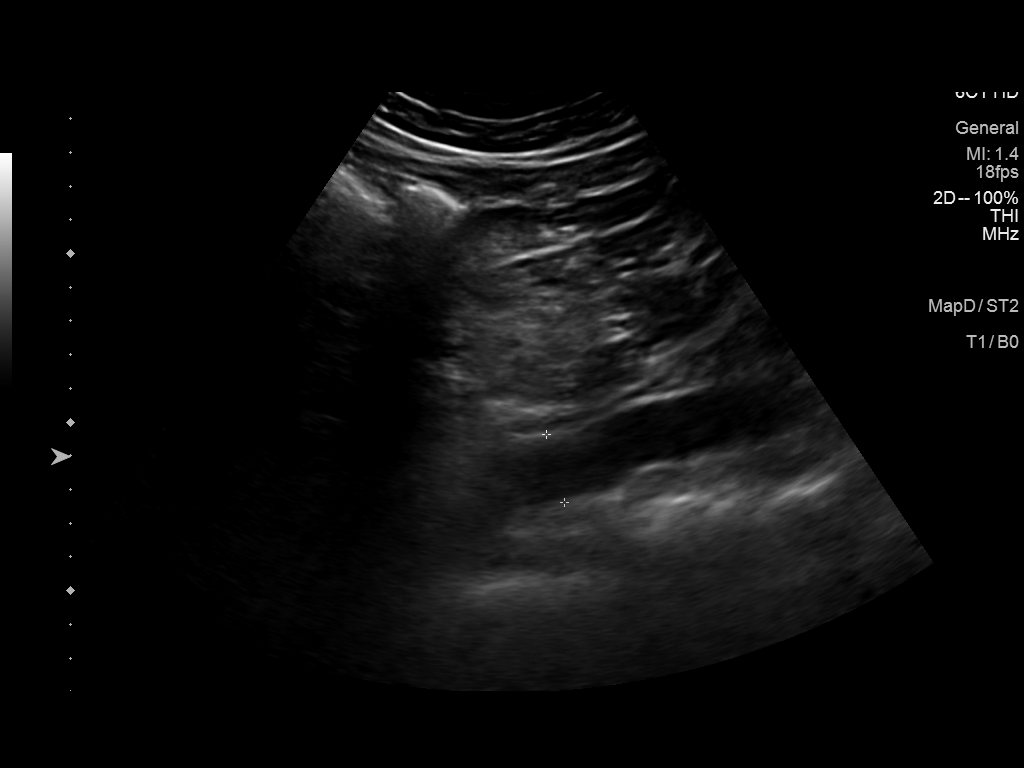
[im 4/7]
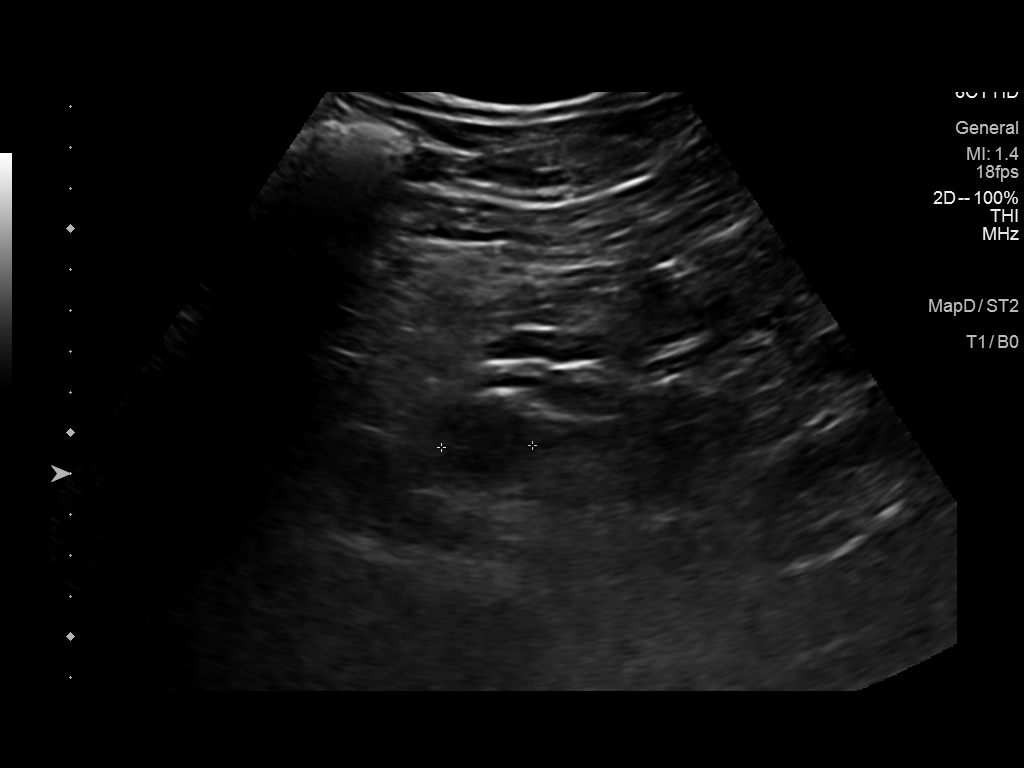
[im 5/7]
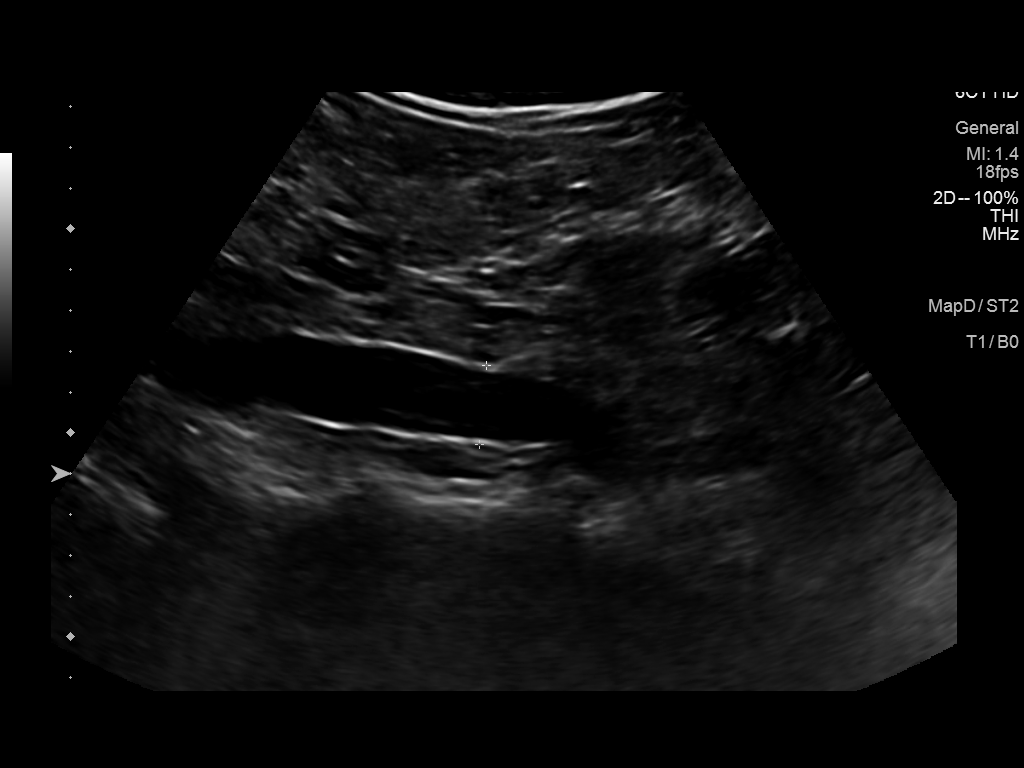
[im 6/7]
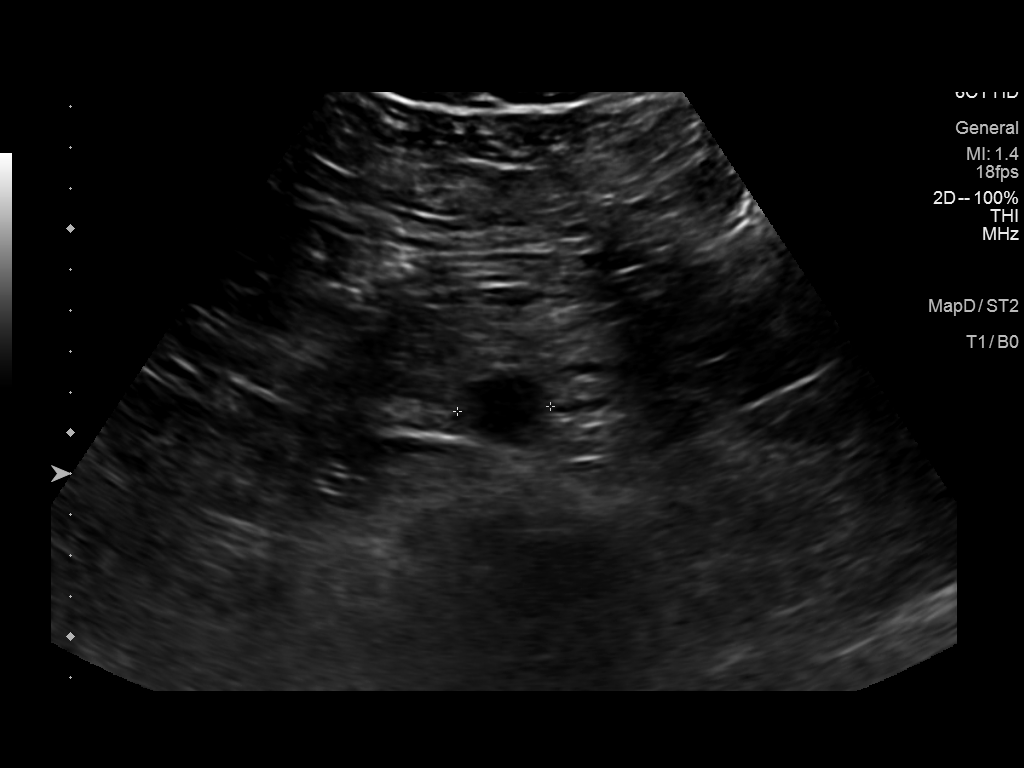
[im 7/7]
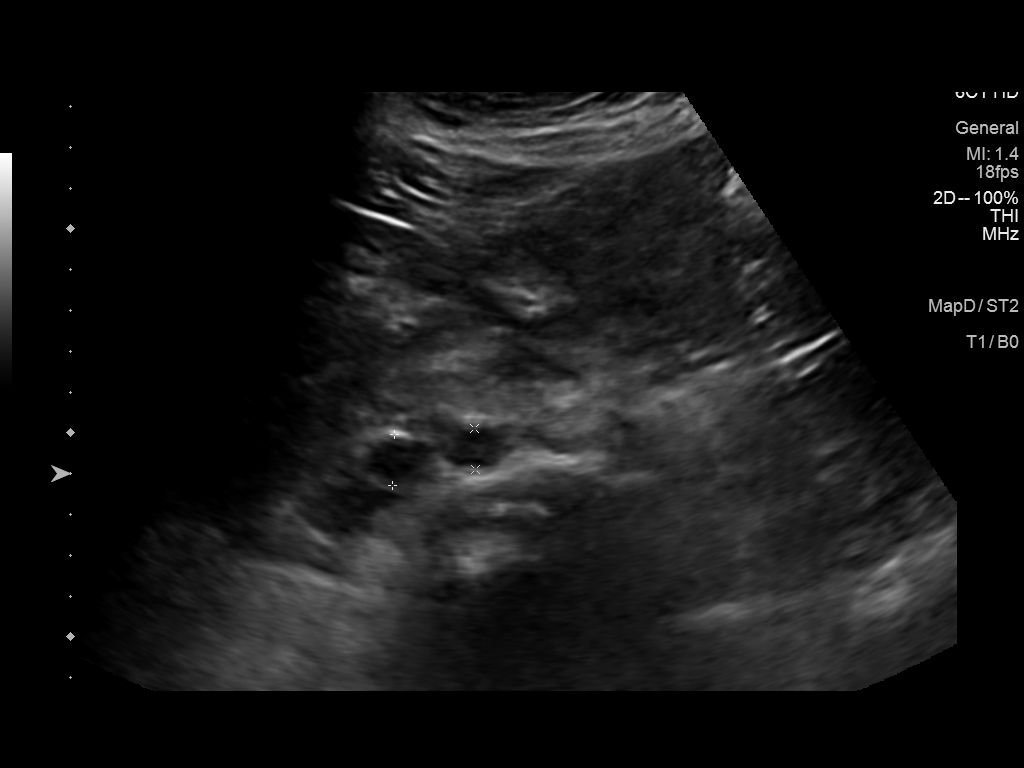

[7 of 7 positions shown; findings below may reference images not displayed]

FINDINGS: Abdominal aortic measurements as follows:

Proximal:  1.8 x 2.5 cm (AP by transverse)

Mid:         2.1 x 2.2 cm

Distal:      1.9 x 2.3 cm

Nonaneurysmal proximal iliac arteries.
IMPRESSION: Negative for abdominal aortic aneurysm.

## 2022-03-01 ENCOUNTER — Other Ambulatory Visit: Payer: Self-pay | Admitting: Internal Medicine

## 2022-03-01 DIAGNOSIS — Z0000 Encounter for general adult medical examination without abnormal findings: Secondary | ICD-10-CM

## 2022-03-28 ENCOUNTER — Ambulatory Visit
Admission: RE | Admit: 2022-03-28 | Discharge: 2022-03-28 | Disposition: A | Discharge status: Home / Self Care | Payer: Medicare Other | Source: Ambulatory Visit | Attending: Internal Medicine | Admitting: Internal Medicine

## 2022-03-28 DIAGNOSIS — Z0000 Encounter for general adult medical examination without abnormal findings: Secondary | ICD-10-CM

## 2022-07-02 ENCOUNTER — Other Ambulatory Visit: Payer: Self-pay | Admitting: Internal Medicine

## 2022-07-02 ENCOUNTER — Other Ambulatory Visit (HOSPITAL_COMMUNITY): Payer: Self-pay | Admitting: Internal Medicine

## 2022-07-02 DIAGNOSIS — R55 Syncope and collapse: Secondary | ICD-10-CM

## 2022-07-04 ENCOUNTER — Ambulatory Visit (HOSPITAL_COMMUNITY)
Admission: RE | Admit: 2022-07-04 | Discharge: 2022-07-04 | Disposition: A | Discharge status: Home / Self Care | Payer: Medicare Other | Source: Ambulatory Visit | Attending: Internal Medicine | Admitting: Internal Medicine

## 2022-07-04 DIAGNOSIS — R55 Syncope and collapse: Secondary | ICD-10-CM | POA: Insufficient documentation

## 2022-07-04 MED ORDER — IOHEXOL 350 MG/ML SOLN
100.0000 mL | Freq: Once | INTRAVENOUS | Status: AC | PRN
  Administered 2022-07-04: 75 mL via INTRAVENOUS

## 2022-07-05 ENCOUNTER — Inpatient Hospital Stay: Payer: Medicare Other

## 2022-07-05 ENCOUNTER — Encounter: Payer: Self-pay | Admitting: Internal Medicine

## 2022-07-05 ENCOUNTER — Ambulatory Visit: Payer: Medicare Other | Admitting: Internal Medicine

## 2022-07-05 VITALS — Temp 97.6°F | Resp 16 | Ht 73.0 in | Wt 263.0 lb

## 2022-07-05 DIAGNOSIS — R002 Palpitations: Secondary | ICD-10-CM

## 2022-07-05 DIAGNOSIS — E782 Mixed hyperlipidemia: Secondary | ICD-10-CM | POA: Insufficient documentation

## 2022-07-05 DIAGNOSIS — I10 Essential (primary) hypertension: Secondary | ICD-10-CM

## 2022-07-05 DIAGNOSIS — I209 Angina pectoris, unspecified: Secondary | ICD-10-CM

## 2022-07-05 MED ORDER — NITROGLYCERIN 0.4 MG SL SUBL: 0.4000 mg | SUBLINGUAL_TABLET | SUBLINGUAL | 3 refills | Status: AC | PRN

## 2022-07-05 NOTE — Progress Notes (Signed)
Primary Physician/Referring:  Velna Hatchet, MD  Patient ID: Fernando Clark., male    DOB: May 24, 1946, 76 y.o.   MRN: [id number]  Chief Complaint  Patient presents with  . New Patient (Initial Visit)  . Bradycardia   HPI:    Fernando Clark.  is a 76 y.o. male with past medical history significant for hypertension, hyperlipidemia, and sleep apnea who is here to establish care with cardiology.  Patient has been having syncopal episodes that have become much more frequent in the last 6 months.  He states that he gets neck pain, chest pain, and palpitations when he is golfing.  He states when he is walking up a hill he feels very weak and fatigued and like his legs give out.  Patient also admits to diaphoresis and chest generally feeling exhausted.  This is all new for him in the last year.  He has not had a stress test or echo in the past.  Patient's heart rate also runs on the lower side, he is agreeable to wearing an event monitor to rule out dangerous arrhythmia or severe symptomatic bradycardia.  Patient denies orthopnea, PND, claudication, edema.  Past Medical History:  Diagnosis Date  . Arthritis    "hands" (09/15/2018)  . Bursitis    R heel  . Depression    tempermental mood  . GERD (gastroesophageal reflux disease)   . Headache   . HLD (hyperlipidemia)   . Hx of vasectomy   . Osteoarthritis    L knee  . Primary localized osteoarthritis of left knee   . Sleep apnea    "I'm getting mask" (09/15/2018)  . Snoring 10/14/2014   Past Surgical History:  Procedure Laterality Date  . APPENDECTOMY    . CATARACT EXTRACTION W/ INTRAOCULAR LENS  IMPLANT, BILATERAL Bilateral   . CHOLECYSTECTOMY OPEN    . COLONOSCOPY  ~ 2009; 2019   [facility] -Dr Daine Gip; in [place]  . ESOPHAGOGASTRODUODENOSCOPY  02/2015  . JOINT REPLACEMENT    . KNEE ARTHROSCOPY Left 1985  . TONSILLECTOMY AND ADENOIDECTOMY    . TOTAL KNEE ARTHROPLASTY Left 09/15/2018  . TOTAL KNEE ARTHROPLASTY Left 09/15/2018    Procedure: TOTAL KNEE ARTHROPLASTY;  Surgeon: Elsie Saas, MD;  Location: [place];  Service: Orthopedics;  Laterality: Left;   Family History  Problem Relation Age of Onset  . Arthritis Mother        lived to 35  . Breast cancer Mother   . Depression Mother   . Hypertension Mother   . Depression Father   . Stroke Father   . Anuerysm Father   . Arthritis Sister   . Cancer Other        grandparent  . Prostate cancer Paternal Grandfather   . Heart attack Maternal Grandfather   . Colon cancer Neg Hx   . Rectal cancer Neg Hx   . Stomach cancer Neg Hx   . Esophageal cancer Neg Hx     Social History   Tobacco Use  . Smoking status: Former    Packs/day: 3.00    Years: 40.00    Total pack years: 120.00    Types: Cigarettes    Quit date: 11/12/2005    Years since quitting: 16.6  . Smokeless tobacco: Former    Types: Chew  Substance Use Topics  . Alcohol use: Yes    Alcohol/week: 2.0 standard drinks of alcohol    Types: 2 Standard drinks or equivalent per week   Marital Status: Divorced  ROS  Review of Systems  Constitutional: Positive for diaphoresis.  Cardiovascular:  Positive for chest pain, dyspnea on exertion, irregular heartbeat, near-syncope, palpitations and syncope.  Neurological:  Positive for light-headedness and weakness.  All other systems reviewed and are negative. Objective  Temperature 97.6 F (36.4 C), temperature source Temporal, resp. rate 16, height '6\' 1"'$  (1.854 m), weight 263 lb (119.3 kg), SpO2 94 %. Body mass index is 34.7 kg/m.     07/05/2022    9:19 AM 11/13/2018    8:11 AM 09/17/2018    1:00 PM  Vitals with BMI  Height '6\' 1"'$  '6\' 1"'$    Weight 263 lbs 260 lbs 3 oz   BMI 61.60 73.71   Systolic  062 694  Diastolic  83 90  Pulse  81 81     Physical Exam Vitals reviewed.  Constitutional:      Appearance: Normal appearance.  HENT:     Head: Normocephalic and atraumatic.  Neck:     Vascular: No carotid bruit.  Cardiovascular:     Rate and  Rhythm: Regular rhythm. Bradycardia present.     Pulses: Normal pulses.     Heart sounds: Normal heart sounds. No murmur heard.    No gallop.  Pulmonary:     Effort: Pulmonary effort is normal.     Breath sounds: Normal breath sounds.  Abdominal:     General: Bowel sounds are normal.     Palpations: Abdomen is soft.  Musculoskeletal:     Right lower leg: No edema.     Left lower leg: No edema.  Skin:    General: Skin is warm and dry.  Neurological:     Mental Status: He is alert.   Medications and allergies  No Known Allergies   Medication list after today's encounter   Current Outpatient Medications:  .  acetaminophen (TYLENOL) 650 MG CR tablet, Take 1,300 mg by mouth every 8 (eight) hours as needed for pain., Disp: , Rfl:  .  amLODipine (NORVASC) 5 MG tablet, TAKE 1 TABLET BY MOUTH EVERY DAY for 90, Disp: , Rfl:  .  atorvastatin (LIPITOR) 20 MG tablet, Take 20 mg by mouth daily., Disp: , Rfl:  .  Cholecalciferol 50 MCG (2000 UT) TABS, 1 tablet Orally Once a day, Disp: , Rfl:  .  citalopram (CELEXA) 20 MG tablet, Take 20 mg by mouth daily., Disp: , Rfl:  .  fexofenadine (ALLEGRA) 180 MG tablet, Take 180 mg by mouth daily., Disp: , Rfl:  .  nitroGLYCERIN (NITROSTAT) 0.4 MG SL tablet, Place 1 tablet (0.4 mg total) under the tongue every 5 (five) minutes as needed for up to 25 days for chest pain., Disp: 25 tablet, Rfl: 3 .  olmesartan (BENICAR) 20 MG tablet, , Disp: , Rfl:   Laboratory examination:   Lab Results  Component Value Date   NA 137 09/17/2018   K 4.5 09/17/2018   CO2 29 09/17/2018   GLUCOSE 181 (H) 09/17/2018   BUN 14 09/17/2018   CREATININE 0.75 09/17/2018   CALCIUM 8.8 (L) 09/17/2018   GFRNONAA >60 09/17/2018       Latest Ref Rng & Units 09/17/2018    3:12 AM 09/16/2018    1:50 AM 09/04/2018   11:09 AM  CMP  Glucose 70 - 99 mg/dL 181  185  104   BUN 8 - 23 mg/dL '14  15  13   '$ Creatinine 0.61 - 1.24 mg/dL 0.75  0.93  0.76   Sodium 135 -  145 mmol/L 137   136  141   Potassium 3.5 - 5.1 mmol/L 4.5  4.0  3.8   Chloride 98 - 111 mmol/L 103  108  106   CO2 22 - 32 mmol/L '29  25  29   '$ Calcium 8.9 - 10.3 mg/dL 8.8  8.8  9.3   Total Protein 6.5 - 8.1 g/dL   6.5   Total Bilirubin 0.3 - 1.2 mg/dL   0.8   Alkaline Phos 38 - 126 U/L   55   AST 15 - 41 U/L   21   ALT 0 - 44 U/L   21       Latest Ref Rng & Units 09/17/2018    3:12 AM 09/16/2018    1:50 AM 09/04/2018   11:09 AM  CBC  WBC 4.0 - 10.5 K/uL 18.2  15.0  6.0   Hemoglobin 13.0 - 17.0 g/dL 10.6  11.8  13.3   Hematocrit 39.0 - 52.0 % 32.2  35.7  40.2   Platelets 150 - 400 K/uL 180  174  217     Lipid Panel No results for input(s): "CHOL", "TRIG", "[place]", "VLDL", "HDL", "CHOLHDL", "LDLDIRECT" in the last 8760 hours.  HEMOGLOBIN A1C No results found for: "HGBA1C", "MPG" TSH No results for input(s): "TSH" in the last 8760 hours.  External labs:     Radiology:    Cardiac Studies:   No results found for this or any previous visit from the past 1095 days.     No results found for this or any previous visit from the past 1095 days.     EKG:     07/05/22 sinus bradycardia , HR 54  Assessment     ICD-10-CM   1. Palpitations  R00.2 EKG 12-Lead    [facility] ECHOCARDIOGRAM COMPLETE    [facility] MYOCARDIAL PERFUSION WO LEXISCAN    LONG TERM MONITOR (3-14 DAYS)    2. Angina pectoris (HCC)  I20.9 PCV ECHOCARDIOGRAM COMPLETE    [facility]V MYOCARDIAL PERFUSION WO LEXISCAN    LONG TERM MONITOR (3-14 DAYS)    3. Essential hypertension  I10     4. Hyperlipidemia, mixed  E78.2        Orders Placed This Encounter  Procedures  . [facility] MYOCARDIAL PERFUSION WO LEXISCAN    Standing Status:   Future    Standing Expiration Date:   09/04/2022  . LONG TERM MONITOR (3-14 DAYS)    Standing Status:   Future    Number of Occurrences:   1    Standing Expiration Date:   07/06/2023    Order Specific Question:   Where should this test be performed?    Answer:   [facility]    Order Specific  Question:   Does the patient have an implanted cardiac device?    Answer:   No    Order Specific Question:   Prescribed days of wear    Answer:   53    Order Specific Question:   Type of enrollment    Answer:   Clinic Enrollment    Order Specific Question:   Release to patient    Answer:   Immediate  . EKG 12-Lead  . PCV ECHOCARDIOGRAM COMPLETE    Standing Status:   Future    Standing Expiration Date:   07/06/2023    Meds ordered this encounter  Medications  . nitroGLYCERIN (NITROSTAT) 0.4 MG SL tablet    Sig: Place 1 tablet (0.4 mg total) under the tongue every  5 (five) minutes as needed for up to 25 days for chest pain.    Dispense:  25 tablet    Refill:  3    There are no discontinued medications.   Recommendations:   Elijiah Mickley. is a 76 y.o. male with hypertension and hyperlipidemia presenting with chest pain  Continue current cardiac medications. Encourage low-sodium diet, less than 2000 mg daily. Schedule imaging tests in office. Echocardiogram and nuclear stress test. Event monitor ordered, patient encouraged to wear this while he golfs as that is when he is most symptomatic Previous lab results discussed with patient  Sublingual nitro sent for chest pain Follow-up in 1-2 months or sooner if needed.     Floydene Flock, DO, [facility]  07/05/2022, 10:58 AM Office: [phone] Pager: [phone]

## 2022-07-12 ENCOUNTER — Ambulatory Visit: Payer: Medicare Other

## 2022-07-12 DIAGNOSIS — R002 Palpitations: Secondary | ICD-10-CM

## 2022-07-12 DIAGNOSIS — I209 Angina pectoris, unspecified: Secondary | ICD-10-CM

## 2022-07-17 ENCOUNTER — Ambulatory Visit: Payer: Medicare Other

## 2022-07-17 ENCOUNTER — Inpatient Hospital Stay: Payer: Medicare Other

## 2022-07-17 DIAGNOSIS — I209 Angina pectoris, unspecified: Secondary | ICD-10-CM

## 2022-07-17 DIAGNOSIS — R002 Palpitations: Secondary | ICD-10-CM

## 2022-07-18 ENCOUNTER — Telehealth: Payer: Self-pay

## 2022-07-22 LAB — PCV MYOCARDIAL PERFUSION WO LEXISCAN
Angina Index: 0
Base ST Depression (mm): 0 mm
ST Depression (mm): 0 mm

## 2022-08-02 ENCOUNTER — Ambulatory Visit: Payer: Medicare Other | Admitting: Internal Medicine

## 2022-08-02 ENCOUNTER — Encounter: Payer: Self-pay | Admitting: Internal Medicine

## 2022-08-02 VITALS — BP 99/58 | HR 72 | Temp 98.0°F | Resp 17 | Ht 73.0 in | Wt 261.0 lb

## 2022-08-02 DIAGNOSIS — E782 Mixed hyperlipidemia: Secondary | ICD-10-CM

## 2022-08-02 DIAGNOSIS — I10 Essential (primary) hypertension: Secondary | ICD-10-CM

## 2022-08-02 DIAGNOSIS — R42 Dizziness and giddiness: Secondary | ICD-10-CM

## 2022-08-02 NOTE — Progress Notes (Signed)
Primary Physician/Referring:  Velna Hatchet, MD  Patient ID: Fernando Corin., male    DOB: 18-Mar-1946, 76 y.o.   MRN: [id number]  Chief Complaint  Patient presents with   Palpitations   Follow-up    4 week   HPI:    Fernando Clark.  is a 76 y.o. male with past medical history significant for hypertension, hyperlipidemia, and sleep apnea who is here for a follow-up visit. He has been doing well. His tests all came back normal. We will continue risk stratification. His BP is a little on the lower side but he takes both BP meds in the morning. Discussed with patient to take amlodi[ine in the morning and benicar at night. He gets a little light-headed with position changes, hopefully spreading the BP meds out will help this. Patient is going golfing on Monday, he will call and let us know if he is still light-headed, if so we can stop amlodipine. Patient denies orthopnea, PND, claudication, edema.  Past Medical History:  Diagnosis Date   Arthritis    "hands" (09/15/2018)   Bursitis    R heel   Depression    tempermental mood   GERD (gastroesophageal reflux disease)    Headache    HLD (hyperlipidemia)    Hx of vasectomy    Osteoarthritis    L knee   Primary localized osteoarthritis of left knee    Sleep apnea    "I'm getting mask" (09/15/2018)   Snoring 10/14/2014   Past Surgical History:  Procedure Laterality Date   APPENDECTOMY     CATARACT EXTRACTION W/ INTRAOCULAR LENS  IMPLANT, BILATERAL Bilateral    CHOLECYSTECTOMY OPEN     COLONOSCOPY  ~ 2009; 2019   [facility] -Dr Daine Gip; in [place]   ESOPHAGOGASTRODUODENOSCOPY  02/2015   JOINT REPLACEMENT     KNEE ARTHROSCOPY Left 1985   TONSILLECTOMY AND ADENOIDECTOMY     TOTAL KNEE ARTHROPLASTY Left 09/15/2018   TOTAL KNEE ARTHROPLASTY Left 09/15/2018   Procedure: TOTAL KNEE ARTHROPLASTY;  Surgeon: Elsie Saas, MD;  Location: [place];  Service: Orthopedics;  Laterality: Left;   Family History  Problem Relation Age of Onset    Arthritis Mother        lived to 16   Breast cancer Mother    Depression Mother    Hypertension Mother    Depression Father    Stroke Father    Anuerysm Father    Arthritis Sister    Cancer Other        grandparent   Prostate cancer Paternal Grandfather    Heart attack Maternal Grandfather    Colon cancer Neg Hx    Rectal cancer Neg Hx    Stomach cancer Neg Hx    Esophageal cancer Neg Hx     Social History   Tobacco Use   Smoking status: Former    Packs/day: 3.00    Years: 40.00    Total pack years: 120.00    Types: Cigarettes    Quit date: 11/12/2005    Years since quitting: 16.7   Smokeless tobacco: Former    Types: Chew  Substance Use Topics   Alcohol use: Yes    Alcohol/week: 2.0 standard drinks of alcohol    Types: 2 Standard drinks or equivalent per week   Marital Status: Divorced  ROS  Review of Systems  Constitutional: Negative for diaphoresis.  Cardiovascular:  Negative for chest pain, dyspnea on exertion, irregular heartbeat, near-syncope, palpitations and syncope.  Neurological:  Positive  for light-headedness. Negative for weakness.  All other systems reviewed and are negative.  Objective  Blood pressure (!) 99/58, pulse 72, temperature 98 F (36.7 C), resp. rate 17, height '6\' 1"'$  (1.854 m), weight 261 lb (118.4 kg), SpO2 94 %. Body mass index is 34.43 kg/m.     08/02/2022    1:34 PM 07/05/2022    9:19 AM 11/13/2018    8:11 AM  Vitals with BMI  Height '6\' 1"'$  '6\' 1"'$  '6\' 1"'$   Weight 261 lbs 263 lbs 260 lbs 3 oz  BMI 34.44 40.98 11.91  Systolic 99  478  Diastolic 58  83  Pulse 72  81     Physical Exam Vitals reviewed.  Constitutional:      Appearance: Normal appearance.  HENT:     Head: Normocephalic and atraumatic.  Neck:     Vascular: No carotid bruit.  Cardiovascular:     Rate and Rhythm: Normal rate and regular rhythm.     Pulses: Normal pulses.     Heart sounds: Normal heart sounds. No murmur heard.    No gallop.  Pulmonary:     Effort:  Pulmonary effort is normal.     Breath sounds: Normal breath sounds.  Abdominal:     General: Bowel sounds are normal.     Palpations: Abdomen is soft.  Musculoskeletal:     Right lower leg: No edema.     Left lower leg: No edema.  Skin:    General: Skin is warm and dry.  Neurological:     Mental Status: He is alert.    Medications and allergies  No Known Allergies   Medication list after today's encounter   Current Outpatient Medications:    acetaminophen (TYLENOL) 650 MG CR tablet, Take 1,300 mg by mouth every 8 (eight) hours as needed for pain., Disp: , Rfl:    amLODipine (NORVASC) 5 MG tablet, TAKE 1 TABLET BY MOUTH EVERY DAY for 90, Disp: , Rfl:    atorvastatin (LIPITOR) 20 MG tablet, Take 20 mg by mouth daily., Disp: , Rfl:    Cholecalciferol 50 MCG (2000 UT) TABS, 1 tablet Orally Once a day, Disp: , Rfl:    citalopram (CELEXA) 20 MG tablet, Take 20 mg by mouth daily., Disp: , Rfl:    fexofenadine (ALLEGRA) 180 MG tablet, Take 180 mg by mouth daily., Disp: , Rfl:    olmesartan (BENICAR) 20 MG tablet, , Disp: , Rfl:    nitroGLYCERIN (NITROSTAT) 0.4 MG SL tablet, Place 1 tablet (0.4 mg total) under the tongue every 5 (five) minutes as needed for up to 25 days for chest pain., Disp: 25 tablet, Rfl: 3  Laboratory examination:   Lab Results  Component Value Date   NA 137 09/17/2018   K 4.5 09/17/2018   CO2 29 09/17/2018   GLUCOSE 181 (H) 09/17/2018   BUN 14 09/17/2018   CREATININE 0.75 09/17/2018   CALCIUM 8.8 (L) 09/17/2018   GFRNONAA >60 09/17/2018       Latest Ref Rng & Units 09/17/2018    3:12 AM 09/16/2018    1:50 AM 09/04/2018   11:09 AM  CMP  Glucose 70 - 99 mg/dL 181  185  104   BUN 8 - 23 mg/dL '14  15  13   '$ Creatinine 0.61 - 1.24 mg/dL 0.75  0.93  0.76   Sodium 135 - 145 mmol/L 137  136  141   Potassium 3.5 - 5.1 mmol/L 4.5  4.0  3.8  Chloride 98 - 111 mmol/L 103  108  106   CO2 22 - 32 mmol/L '29  25  29   '$ Calcium 8.9 - 10.3 mg/dL 8.8  8.8  9.3    Total Protein 6.5 - 8.1 g/dL   6.5   Total Bilirubin 0.3 - 1.2 mg/dL   0.8   Alkaline Phos 38 - 126 U/L   55   AST 15 - 41 U/L   21   ALT 0 - 44 U/L   21       Latest Ref Rng & Units 09/17/2018    3:12 AM 09/16/2018    1:50 AM 09/04/2018   11:09 AM  CBC  WBC 4.0 - 10.5 K/uL 18.2  15.0  6.0   Hemoglobin 13.0 - 17.0 g/dL 10.6  11.8  13.3   Hematocrit 39.0 - 52.0 % 32.2  35.7  40.2   Platelets 150 - 400 K/uL 180  174  217     Lipid Panel No results for input(s): "CHOL", "TRIG", "[place]", "VLDL", "HDL", "CHOLHDL", "LDLDIRECT" in the last 8760 hours.  HEMOGLOBIN A1C No results found for: "HGBA1C", "MPG" TSH No results for input(s): "TSH" in the last 8760 hours.  External labs:     Radiology:    Cardiac Studies:   Event monitor Patch Wear Time:  0 days and 22 hours (2023-09-05T12:34:11-398 to 2023-09-06T10:39:58-0400) Patient had a min HR of 47 bpm, max HR of 117 bpm, and avg HR of 66 bpm. Predominant underlying rhythm was Sinus Rhythm. Isolated SVEs were rare (<1.0%), and no SVE Couplets or SVE Triplets were present. Isolated VEs were rare (<1.0%), and no VE Couplets  or VE Triplets were present.    Exercise nuclear stress test 07/17/2022: Myocardial perfusion is normal. Overall LV systolic function is normal without regional wall motion abnormalities. Stress LV EF: 55%.  Normal ECG stress. The patient exercised for 6 minutes and 33 seconds of a Bruce protocol, achieving approximately 7.88 METsand 86% MPHR. Normal BP response.  No previous exam available for comparison. Low risk.    Echocardiogram 07/12/2022:  Normal LV systolic function with visual EF 60-65%. Left ventricle cavity  is normal in size. Mild concentric hypertrophy of the left ventricle.  Normal global wall motion. Doppler evidence of grade I (impaired)  diastolic dysfunction, normal LAP. Calculated EF 62%.  Structurally normal tricuspid valve.  Mild tricuspid regurgitation. No  evidence of pulmonary  hypertension.  No prior available for comparison.    EKG:     07/05/22 sinus bradycardia , HR 54  Assessment     ICD-10-CM   1. Essential hypertension  I10     2. Hyperlipidemia, mixed  E78.2     3. Light-headed feeling  R42        No orders of the defined types were placed in this encounter.   No orders of the defined types were placed in this encounter.   There are no discontinued medications.   Recommendations:   Fernando Clark. is a 76 y.o. male with hypertension and hyperlipidemia  Continue current cardiac medications. Take amlodipine in the morning and benicar at night. If still light-headed, will discontinue amlodipine Encourage low-sodium diet, less than 2000 mg daily. Discussed results of echocardiogram and nuclear stress test. Event monitor worn, no dangerous arrhythmia noted - initial monitor fell off, awaiting pt to send current one back to company Follow-up in 6 months or sooner if needed.     Floydene Flock, DO, [facility]  08/02/2022, 1:48 PM  Office: [phone] Pager: [phone]

## 2023-01-31 ENCOUNTER — Ambulatory Visit: Payer: Medicare Other | Admitting: Internal Medicine

## 2023-01-31 ENCOUNTER — Encounter: Payer: Self-pay | Admitting: Internal Medicine

## 2023-01-31 VITALS — BP 112/66 | HR 66 | Ht 73.0 in | Wt 265.4 lb

## 2023-01-31 DIAGNOSIS — E782 Mixed hyperlipidemia: Secondary | ICD-10-CM

## 2023-01-31 DIAGNOSIS — I10 Essential (primary) hypertension: Secondary | ICD-10-CM

## 2023-01-31 DIAGNOSIS — R42 Dizziness and giddiness: Secondary | ICD-10-CM

## 2023-01-31 MED ORDER — OLMESARTAN MEDOXOMIL 20 MG PO TABS: 10.0000 mg | ORAL_TABLET | Freq: Every day | ORAL | 3 refills | Status: AC

## 2023-01-31 NOTE — Progress Notes (Signed)
Primary Physician/Referring:  Velna Hatchet, MD  Patient ID: Fernando Corin., male    DOB: July 22, 1946, 77 y.o.   MRN: [id number]  Chief Complaint  Patient presents with   Hypertension   Follow-up   HPI:    Fernando Clark.  is a 77 y.o. male with past medical history significant for hypertension, hyperlipidemia, and sleep apnea who is here for a follow-up visit. He has been doing well. He did stop his amlodipine because he was still feeling light-headed and although it is better he is still very light-headed with positional changes. He says when he stands up he has to brace himself because he feels very light-headed. Patient is agreeable to decreasing his anti-hypertensive medications. Otherwise, he has been feeling well. He is still golfing and doing all of his activities without issues. Patient denies orthopnea, PND, claudication, edema.  Past Medical History:  Diagnosis Date   Arthritis    "hands" (09/15/2018)   Bursitis    R heel   Depression    tempermental mood   GERD (gastroesophageal reflux disease)    Headache    HLD (hyperlipidemia)    Hx of vasectomy    Osteoarthritis    L knee   Primary localized osteoarthritis of left knee    Sleep apnea    "I'm getting mask" (09/15/2018)   Snoring 10/14/2014   Past Surgical History:  Procedure Laterality Date   APPENDECTOMY     CATARACT EXTRACTION W/ INTRAOCULAR LENS  IMPLANT, BILATERAL Bilateral    CHOLECYSTECTOMY OPEN     COLONOSCOPY  ~ 2009; 2019   [facility] -Dr Daine Gip; in [place]   ESOPHAGOGASTRODUODENOSCOPY  02/2015   JOINT REPLACEMENT     KNEE ARTHROSCOPY Left 1985   TONSILLECTOMY AND ADENOIDECTOMY     TOTAL KNEE ARTHROPLASTY Left 09/15/2018   TOTAL KNEE ARTHROPLASTY Left 09/15/2018   Procedure: TOTAL KNEE ARTHROPLASTY;  Surgeon: Elsie Saas, MD;  Location: [place];  Service: Orthopedics;  Laterality: Left;   Family History  Problem Relation Age of Onset   Arthritis Mother        lived to 75   Breast cancer  Mother    Depression Mother    Hypertension Mother    Depression Father    Stroke Father    Anuerysm Father    Arthritis Sister    Cancer Other        grandparent   Prostate cancer Paternal Grandfather    Heart attack Maternal Grandfather    Colon cancer Neg Hx    Rectal cancer Neg Hx    Stomach cancer Neg Hx    Esophageal cancer Neg Hx     Social History   Tobacco Use   Smoking status: Former    Packs/day: 3.00    Years: 40.00    Additional pack years: 0.00    Total pack years: 120.00    Types: Cigarettes    Quit date: 11/12/2005    Years since quitting: 17.2   Smokeless tobacco: Former    Types: Chew  Substance Use Topics   Alcohol use: Yes    Alcohol/week: 2.0 standard drinks of alcohol    Types: 2 Standard drinks or equivalent per week   Marital Status: Divorced  ROS  Review of Systems  Constitutional: Negative for diaphoresis.  Cardiovascular:  Negative for chest pain, dyspnea on exertion, irregular heartbeat, near-syncope, palpitations and syncope.  Neurological:  Positive for light-headedness. Negative for weakness.  All other systems reviewed and are negative.  Objective  Blood pressure 112/66, pulse 66, height 6\' 1"  (1.854 m), weight 265 lb 6.4 oz (120.4 kg), SpO2 96 %. Body mass index is 35.02 kg/m.     01/31/2023    8:49 AM 08/02/2022    1:34 PM 07/05/2022    9:19 AM  Vitals with BMI  Height 6\' 1"  6\' 1"  6\' 1"   Weight 265 lbs 6 oz 261 lbs 263 lbs  BMI 35.02 [id number]  Systolic [id number] 99   Diastolic 66 58   Pulse 66 72      Physical Exam Vitals reviewed.  Constitutional:      Appearance: Normal appearance.  HENT:     Head: Normocephalic and atraumatic.  Neck:     Vascular: No carotid bruit.  Cardiovascular:     Rate and Rhythm: Normal rate and regular rhythm.     Pulses: Normal pulses.     Heart sounds: Normal heart sounds. No murmur heard.    No gallop.  Pulmonary:     Effort: Pulmonary effort is normal.     Breath sounds: Normal breath  sounds.  Abdominal:     General: Bowel sounds are normal.     Palpations: Abdomen is soft.  Musculoskeletal:     Right lower leg: No edema.     Left lower leg: No edema.  Skin:    General: Skin is warm and dry.  Neurological:     Mental Status: He is alert.    Medications and allergies   Allergies  Allergen Reactions   Other Other (See Comments)     Medication list after today's encounter   Current Outpatient Medications:    acetaminophen (TYLENOL) 650 MG CR tablet, Take 1,300 mg by mouth every 8 (eight) hours as needed for pain., Disp: , Rfl:    atorvastatin (LIPITOR) 20 MG tablet, Take 20 mg by mouth daily., Disp: , Rfl:    Cholecalciferol 50 MCG (2000 UT) TABS, 1 tablet Orally Once a day, Disp: , Rfl:    citalopram (CELEXA) 40 MG tablet, Take 40 mg by mouth daily., Disp: , Rfl:    fexofenadine (ALLEGRA) 180 MG tablet, Take 180 mg by mouth daily., Disp: , Rfl:    fluticasone (FLONASE) 50 MCG/ACT nasal spray, Place 1 spray into both nostrils daily., Disp: , Rfl:    olmesartan (BENICAR) 20 MG tablet, Take 0.5 tablets (10 mg total) by mouth daily., Disp: 90 tablet, Rfl: 3   nitroGLYCERIN (NITROSTAT) 0.4 MG SL tablet, Place 1 tablet (0.4 mg total) under the tongue every 5 (five) minutes as needed for up to 25 days for chest pain. (Patient not taking: Reported on 01/31/2023), Disp: 25 tablet, Rfl: 3  Laboratory examination:   Lab Results  Component Value Date   NA 137 09/17/2018   K 4.5 09/17/2018   CO2 29 09/17/2018   GLUCOSE 181 (H) 09/17/2018   BUN 14 09/17/2018   CREATININE 0.75 09/17/2018   CALCIUM 8.8 (L) 09/17/2018   GFRNONAA >60 09/17/2018       Latest Ref Rng & Units 09/17/2018    3:12 AM 09/16/2018    1:50 AM 09/04/2018   11:09 AM  CMP  Glucose 70 - 99 mg/dL 181  185  104   BUN 8 - 23 mg/dL 14  15  13    Creatinine 0.61 - 1.24 mg/dL 0.75  0.93  0.76   Sodium 135 - 145 mmol/L 137  136  141   Potassium 3.5 - 5.1 mmol/L 4.5  4.0  3.8  Chloride 98 - 111 mmol/L  103  108  106   CO2 22 - 32 mmol/L 29  25  29    Calcium 8.9 - 10.3 mg/dL 8.8  8.8  9.3   Total Protein 6.5 - 8.1 g/dL   6.5   Total Bilirubin 0.3 - 1.2 mg/dL   0.8   Alkaline Phos 38 - 126 U/L   55   AST 15 - 41 U/L   21   ALT 0 - 44 U/L   21       Latest Ref Rng & Units 09/17/2018    3:12 AM 09/16/2018    1:50 AM 09/04/2018   11:09 AM  CBC  WBC 4.0 - 10.5 K/uL 18.2  15.0  6.0   Hemoglobin 13.0 - 17.0 g/dL 10.6  11.8  13.3   Hematocrit 39.0 - 52.0 % 32.2  35.7  40.2   Platelets 150 - 400 K/uL 180  174  217     Lipid Panel No results for input(s): "CHOL", "TRIG", "[place]", "VLDL", "HDL", "CHOLHDL", "LDLDIRECT" in the last 8760 hours.  HEMOGLOBIN A1C No results found for: "HGBA1C", "MPG" TSH No results for input(s): "TSH" in the last 8760 hours.  External labs:     Radiology:    Cardiac Studies:   Event monitor Patch Wear Time:  0 days and 22 hours (2023-09-05T12:34:11-398 to 2023-09-06T10:39:58-0400) Patient had a min HR of 47 bpm, max HR of 117 bpm, and avg HR of 66 bpm. Predominant underlying rhythm was Sinus Rhythm. Isolated SVEs were rare (<1.0%), and no SVE Couplets or SVE Triplets were present. Isolated VEs were rare (<1.0%), and no VE Couplets  or VE Triplets were present.    Exercise nuclear stress test 07/17/2022: Myocardial perfusion is normal. Overall LV systolic function is normal without regional wall motion abnormalities. Stress LV EF: 55%.  Normal ECG stress. The patient exercised for 6 minutes and 33 seconds of a Bruce protocol, achieving approximately 7.88 METsand 86% MPHR. Normal BP response.  No previous exam available for comparison. Low risk.    Echocardiogram 07/12/2022:  Normal LV systolic function with visual EF 60-65%. Left ventricle cavity  is normal in size. Mild concentric hypertrophy of the left ventricle.  Normal global wall motion. Doppler evidence of grade I (impaired)  diastolic dysfunction, normal LAP. Calculated EF 62%.   Structurally normal tricuspid valve.  Mild tricuspid regurgitation. No  evidence of pulmonary hypertension.  No prior available for comparison.    EKG:     07/05/22 sinus bradycardia , HR 54  Assessment     ICD-10-CM   1. Essential hypertension  I10     2. Hyperlipidemia, mixed  E78.2     3. Light-headed feeling  R42        No orders of the defined types were placed in this encounter.   Meds ordered this encounter  Medications   olmesartan (BENICAR) 20 MG tablet    Sig: Take 0.5 tablets (10 mg total) by mouth daily.    Dispense:  90 tablet    Refill:  3    Medications Discontinued During This Encounter  Medication Reason   olmesartan (BENICAR) 20 MG tablet Dose change   citalopram (CELEXA) 20 MG tablet Dose change   amLODipine (NORVASC) 5 MG tablet    olmesartan (BENICAR) 40 MG tablet      Recommendations:   Fernando Tiegs. is a 77 y.o. male with hypertension and hyperlipidemia  Essential hypertension Light-headed feeling Continue current cardiac medications  with the following changes: take Olmesartan 10 mg at nighttime. If still light-headed, we will send new dose of 5 mg and if he is still symptomatic we can stop his blood pressure medications entirely. His supine BP is only 112/66 and his standing SBP gets as low at 80. Patient will keep log of his pressures and update me with his symptoms. He may not need any anti-hypertensive for BP management given his standing BP is so low.  Patient to decrease caffeine intake and increase hydration. Recommend compressions stocking and changing positions slowly. He is to come back to acute visit if symptoms persist or get worse. Encourage low-sodium diet, less than 2000 mg daily.   Hyperlipidemia, mixed Continue Lipitor 20 mg daily Primary following lipids Follow-up in 6-12 months or sooner if needed.     Floydene Flock, DO, [facility]  01/31/2023, 9:42 AM Office: [phone] Pager: [phone]

## 2023-03-20 ENCOUNTER — Other Ambulatory Visit: Payer: Self-pay | Admitting: Internal Medicine

## 2023-03-20 DIAGNOSIS — Z0000 Encounter for general adult medical examination without abnormal findings: Secondary | ICD-10-CM

## 2023-03-20 DIAGNOSIS — Z87891 Personal history of nicotine dependence: Secondary | ICD-10-CM

## 2023-04-16 ENCOUNTER — Ambulatory Visit (AMBULATORY_SURGERY_CENTER): Payer: Medicare Other

## 2023-04-16 ENCOUNTER — Encounter: Payer: Self-pay | Admitting: Internal Medicine

## 2023-04-16 VITALS — Ht 73.0 in | Wt 264.0 lb

## 2023-04-16 DIAGNOSIS — F419 Anxiety disorder, unspecified: Secondary | ICD-10-CM | POA: Insufficient documentation

## 2023-04-16 DIAGNOSIS — G8929 Other chronic pain: Secondary | ICD-10-CM | POA: Insufficient documentation

## 2023-04-16 DIAGNOSIS — Z86010 Personal history of colonic polyps: Secondary | ICD-10-CM

## 2023-04-16 MED ORDER — NA SULFATE-K SULFATE-MG SULF 17.5-3.13-1.6 GM/177ML PO SOLN: 1.0000 | Freq: Once | ORAL | 0 refills | Status: AC

## 2023-04-16 NOTE — Progress Notes (Signed)
No egg or soy allergy known to patient  No issues known to pt with past sedation with any surgeries or procedures Patient denies ever being told they had issues or difficulty with intubation  No FH of Malignant Hyperthermia Pt is not on diet pills Pt is not on  home 02  Pt is not on blood thinners  Pt denies issues with constipation  No A fib or A flutter Have any cardiac testing pending--no  Pt is ambulatory  Patient's chart reviewed by Cathlyn Parsons CNRA prior to previsit and patient appropriate for the [facility].  Previsit completed and red dot placed by patient's name on their procedure day (on provider's schedule).     PV completed with patient. Prep instructions reviewed and sent via mychart and to home address. Goodrx coupon for [facility] provided.  Pt instructed to use Single[employer].com or GoodRx for a price reduction on prep

## 2023-04-23 ENCOUNTER — Ambulatory Visit
Admission: RE | Admit: 2023-04-23 | Discharge: 2023-04-23 | Disposition: A | Discharge status: Home / Self Care | Payer: Medicare Other | Source: Ambulatory Visit | Attending: Internal Medicine | Admitting: Internal Medicine

## 2023-04-23 DIAGNOSIS — Z0000 Encounter for general adult medical examination without abnormal findings: Secondary | ICD-10-CM

## 2023-04-23 DIAGNOSIS — Z87891 Personal history of nicotine dependence: Secondary | ICD-10-CM

## 2023-04-30 ENCOUNTER — Inpatient Hospital Stay
Admission: RE | Admit: 2023-04-30 | Payer: Medicare Other | Source: Ambulatory Visit | Attending: Internal Medicine | Admitting: Internal Medicine

## 2023-04-30 ENCOUNTER — Other Ambulatory Visit: Payer: Self-pay | Admitting: Internal Medicine

## 2023-04-30 ENCOUNTER — Ambulatory Visit
Admission: RE | Admit: 2023-04-30 | Discharge: 2023-04-30 | Disposition: A | Discharge status: Home / Self Care | Payer: Medicare Other | Source: Ambulatory Visit | Attending: Internal Medicine | Admitting: Internal Medicine

## 2023-04-30 DIAGNOSIS — Z87891 Personal history of nicotine dependence: Secondary | ICD-10-CM

## 2023-05-01 ENCOUNTER — Ambulatory Visit (AMBULATORY_SURGERY_CENTER): Payer: Medicare Other | Admitting: Internal Medicine

## 2023-05-01 ENCOUNTER — Encounter: Payer: Self-pay | Admitting: Internal Medicine

## 2023-05-01 VITALS — BP 133/80 | HR 63 | Temp 97.8°F | Resp 14 | Ht 73.0 in | Wt 264.0 lb

## 2023-05-01 DIAGNOSIS — Z09 Encounter for follow-up examination after completed treatment for conditions other than malignant neoplasm: Secondary | ICD-10-CM

## 2023-05-01 DIAGNOSIS — Z86010 Personal history of colonic polyps: Secondary | ICD-10-CM | POA: Diagnosis not present

## 2023-05-01 DIAGNOSIS — K635 Polyp of colon: Secondary | ICD-10-CM | POA: Diagnosis not present

## 2023-05-01 DIAGNOSIS — D122 Benign neoplasm of ascending colon: Secondary | ICD-10-CM

## 2023-05-01 MED ORDER — SODIUM CHLORIDE 0.9 % IV SOLN
500.0000 mL | Freq: Once | INTRAVENOUS | Status: DC
Start: 2023-05-01 — End: 2023-05-01

## 2023-05-01 NOTE — Patient Instructions (Addendum)
Recommendation:           - Repeat colonoscopy is not recommended for                            surveillance.                           - Patient has a contact number available for                            emergencies. The signs and symptoms of potential                            delayed complications were discussed with the                            patient. Return to normal activities tomorrow.                            Written discharge instructions were provided to the                            patient.                           - Resume previous diet.                           - Continue present medications.                           - Await pathology results.  Handout on polyps, hemorrhoids and diverticulosis given.  YOU HAD AN ENDOSCOPIC PROCEDURE TODAY AT THE [facility]:   Refer to the procedure report that was given to you for any specific questions about what was found during the examination.  If the procedure report does not answer your questions, please call your gastroenterologist to clarify.  If you requested that your care partner not be given the details of your procedure findings, then the procedure report has been included in a sealed envelope for you to review at your convenience later.  YOU SHOULD EXPECT: Some feelings of bloating in the abdomen. Passage of more gas than usual.  Walking can help get rid of the air that was put into your GI tract during the procedure and reduce the bloating. If you had a lower endoscopy (such as a colonoscopy or flexible sigmoidoscopy) you may notice spotting of blood in your stool or on the toilet paper. If you underwent a bowel prep for your procedure, you may not have a normal bowel movement for a few days.  Please Note:  You might notice some irritation and congestion in your nose or some drainage.  This is from the oxygen used during your procedure.  There is no need for concern and it should clear up in a day or  so.  SYMPTOMS TO REPORT IMMEDIATELY:  Following lower endoscopy (colonoscopy or flexible sigmoidoscopy):  Excessive amounts of blood in the stool  Significant tenderness or worsening of abdominal pains  Swelling of the abdomen that is new, acute  Fever  of 100F or higher  For urgent or emergent issues, a gastroenterologist can be reached at any hour by calling [phone]. Do not use MyChart messaging for urgent concerns.    DIET:  We do recommend a small meal at first, but then you may proceed to your regular diet.  Drink plenty of fluids but you should avoid alcoholic beverages for 24 hours.  ACTIVITY:  You should plan to take it easy for the rest of today and you should NOT DRIVE or use heavy machinery until tomorrow (because of the sedation medicines used during the test).    FOLLOW UP: Our staff will call the number listed on your records the next business day following your procedure.  We will call around 7:15- 8:00 am to check on you and address any questions or concerns that you may have regarding the information given to you following your procedure. If we do not reach you, we will leave a message.     If any biopsies were taken you will be contacted by phone or by letter within the next 1-3 weeks.  Please call us at [phone] if you have not heard about the biopsies in 3 weeks.    SIGNATURES/CONFIDENTIALITY: You and/or your care partner have signed paperwork which will be entered into your electronic medical record.  These signatures attest to the fact that that the information above on your After Visit Summary has been reviewed and is understood.  Full responsibility of the confidentiality of this discharge information lies with you and/or your care-partner.

## 2023-05-01 NOTE — Progress Notes (Signed)
1555 Pt desat to 60's.  Oral a/w #9 placed.  Manual bagging 100% O2.  Patient recovered to baseline. 1600 MN

## 2023-05-01 NOTE — Progress Notes (Signed)
Called to room to assist during endoscopic procedure.  Patient ID and intended procedure confirmed with present staff. Received instructions for my participation in the procedure from the performing physician.  

## 2023-05-01 NOTE — Progress Notes (Signed)
Vss nad trans to pacu 

## 2023-05-01 NOTE — Op Note (Signed)
[facility] Patient Name: Fernando Clark Procedure Date: 05/01/2023 3:44 PM MRN: [id number] Endoscopist: Wilhemina Bonito. Marina Goodell , MD, [id number] Age: 77 Referring MD:  Date of Birth: 01/29/1946 Gender: Male Account #: [id number] Procedure:                Colonoscopy with cold snare polypectomy x 1; biopsy                            polypectomy x 1 Indications:              High risk colon cancer surveillance: Personal                            history of non-advanced adenoma, High risk colon                            cancer surveillance: Personal history of sessile                            serrated colon polyp (less than 10 mm in size) with                            no dysplasia. Previous examinations elsewhere then                            most recently here 2016 Medicines:                Monitored Anesthesia Care Procedure:                Pre-Anesthesia Assessment:                           - Prior to the procedure, a History and Physical                            was performed, and patient medications and                            allergies were reviewed. The patient's tolerance of                            previous anesthesia was also reviewed. The risks                            and benefits of the procedure and the sedation                            options and risks were discussed with the patient.                            All questions were answered, and informed consent                            was obtained. Prior Anticoagulants: The patient has  taken no anticoagulant or antiplatelet agents. ASA                            Grade Assessment: II - A patient with mild systemic                            disease. After reviewing the risks and benefits,                            the patient was deemed in satisfactory condition to                            undergo the procedure.                           After obtaining informed  consent, the colonoscope                            was passed under direct vision. Throughout the                            procedure, the patient's blood pressure, pulse, and                            oxygen saturations were monitored continuously. The                            CF [id number] #[id number] was introduced through the anus                            and advanced to the the cecum, identified by                            appendiceal orifice and ileocecal valve. The                            ileocecal valve, appendiceal orifice, and rectum                            were photographed. The quality of the bowel                            preparation was good. The colonoscopy was performed                            without difficulty. The patient tolerated the                            procedure well. The bowel preparation used was                            SUPREP via split dose instruction. Scope In: 3:54:32 PM Scope Out: 4:14:10 PM Scope Withdrawal Time: 0 hours 17 minutes 5 seconds  Total Procedure Duration:  0 hours 19 minutes 38 seconds  Findings:                 A 4 mm polyp was found in the ascending colon. The                            polyp was removed with a cold snare. Resection and                            retrieval were complete.                           A 1 mm polyp was found in the ascending colon. The                            polyp was removed with a jumbo cold forceps.                            Resection and retrieval were complete.                           Multiple diverticula were found in the left colon.                           Internal hemorrhoids were found during                            retroflexion. The hemorrhoids were moderate.                           The exam was otherwise without abnormality on                            direct and retroflexion views. Complications:            No immediate complications. Estimated blood loss:                             None. Estimated Blood Loss:     Estimated blood loss: none. Impression:               - One 4 mm polyp in the ascending colon, removed                            with a cold snare. Resected and retrieved.                           - One 1 mm polyp in the ascending colon, removed                            with a jumbo cold forceps. Resected and retrieved.                           - Diverticulosis in the left colon.                           -  Internal hemorrhoids.                           - The examination was otherwise normal on direct                            and retroflexion views. Recommendation:           - Repeat colonoscopy is not recommended for                            surveillance.                           - Patient has a contact number available for                            emergencies. The signs and symptoms of potential                            delayed complications were discussed with the                            patient. Return to normal activities tomorrow.                            Written discharge instructions were provided to the                            patient.                           - Resume previous diet.                           - Continue present medications.                           - Await pathology results. Wilhemina Bonito. Marina Goodell, MD 05/01/2023 4:21:01 PM This report has been signed electronically.

## 2023-05-01 NOTE — Progress Notes (Signed)
Pt's states no medical or surgical changes since previsit or office visit. 

## 2023-05-01 NOTE — Progress Notes (Signed)
HISTORY OF PRESENT ILLNESS:  Fernando Natale. is a 77 y.o. male with history of adenomatous send serrated polyps.  Now for surveillance colonoscopy.  Last examination 2016  REVIEW OF SYSTEMS:  All non-GI ROS negative except for  Past Medical History:  Diagnosis Date   Arthritis    "hands" (09/15/2018)   Bursitis    R heel   Depression    tempermental mood   GERD (gastroesophageal reflux disease)    Headache    HLD (hyperlipidemia)    Hx of vasectomy    Hypertension    Osteoarthritis    L knee   Primary localized osteoarthritis of left knee    Sleep apnea    "I'm getting mask" (09/15/2018)   Snoring 10/14/2014    Past Surgical History:  Procedure Laterality Date   APPENDECTOMY     CATARACT EXTRACTION W/ INTRAOCULAR LENS  IMPLANT, BILATERAL Bilateral    CHOLECYSTECTOMY OPEN     COLONOSCOPY  ~ 2009; 2019   [facility] -Dr Marney Setting; in [place]   ESOPHAGOGASTRODUODENOSCOPY  02/2015   JOINT REPLACEMENT     KNEE ARTHROSCOPY Left 1985   TONSILLECTOMY AND ADENOIDECTOMY     TOTAL KNEE ARTHROPLASTY Left 09/15/2018   TOTAL KNEE ARTHROPLASTY Left 09/15/2018   Procedure: TOTAL KNEE ARTHROPLASTY;  Surgeon: Salvatore Marvel, MD;  Location: [facility] OR;  Service: Orthopedics;  Laterality: Left;    Social History Fernando Clark.  reports that he quit smoking about 17 years ago. His smoking use included cigarettes. He has a 120.00 pack-year smoking history. He quit smokeless tobacco use about 34 years ago.  His smokeless tobacco use included chew. He reports current alcohol use of about 2.0 standard drinks of alcohol per week. He reports that he does not use drugs.  family history includes Anuerysm in his father; Arthritis in his mother and sister; Breast cancer in his mother; Cancer in an other family member; Depression in his father and mother; Heart attack in his maternal grandfather; Hypertension in his mother; Prostate cancer in his paternal grandfather; Stroke in his father.  No Known  Allergies     PHYSICAL EXAMINATION: Vital signs: BP (!) 157/95   Pulse 62   Temp 97.8 F (36.6 C) (Temporal)   Resp 14   Ht 6\' 1"  (1.854 m)   Wt 264 lb (119.7 kg)   SpO2 95%   BMI 34.83 kg/m  General: Well-developed, well-nourished, no acute distress HEENT: Sclerae are anicteric, conjunctiva pink. Oral mucosa intact Lungs: Clear Heart: Regular Abdomen: soft, nontender, nondistended, no obvious ascites, no peritoneal signs, normal bowel sounds. No organomegaly. Extremities: No edema Psychiatric: alert and oriented x3. Cooperative     ASSESSMENT:  History of adenomatous and sessile serrated polyps   PLAN:  Surveillance colonoscopy

## 2023-05-02 ENCOUNTER — Telehealth: Payer: Self-pay | Admitting: *Deleted

## 2023-05-02 NOTE — Telephone Encounter (Signed)
Post procedure follow up phone call. No answer at number given.  Left message on voicemail.  

## 2023-05-07 ENCOUNTER — Encounter: Payer: Self-pay | Admitting: Internal Medicine

## 2023-07-17 NOTE — Telephone Encounter (Signed)
error 

## 2024-01-31 ENCOUNTER — Ambulatory Visit: Payer: Medicare Other | Attending: Internal Medicine | Admitting: Cardiology

## 2024-01-31 ENCOUNTER — Ambulatory Visit: Payer: Medicare Other | Admitting: Internal Medicine
